# Patient Record
Sex: Female | Born: 1990 | Race: Black or African American | Hispanic: No | Marital: Married | Smoking: Never smoker
Health system: Southern US, Community
[De-identification: ages and names within clinical notes are randomized; demographics above are authoritative.]

## PROBLEM LIST (undated history)

## (undated) ENCOUNTER — Emergency Department (HOSPITAL_BASED_OUTPATIENT_CLINIC_OR_DEPARTMENT_OTHER): Admission: EM | Discharge status: Absent without leave | Payer: Medicaid Other

## (undated) DIAGNOSIS — D219 Benign neoplasm of connective and other soft tissue, unspecified: Secondary | ICD-10-CM

## (undated) DIAGNOSIS — Z5189 Encounter for other specified aftercare: Secondary | ICD-10-CM

## (undated) DIAGNOSIS — F209 Schizophrenia, unspecified: Secondary | ICD-10-CM

## (undated) DIAGNOSIS — F319 Bipolar disorder, unspecified: Secondary | ICD-10-CM

## (undated) DIAGNOSIS — D649 Anemia, unspecified: Secondary | ICD-10-CM

## (undated) DIAGNOSIS — N946 Dysmenorrhea, unspecified: Secondary | ICD-10-CM

## (undated) HISTORY — PX: DILATION AND CURETTAGE OF UTERUS: SHX78

---
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
## 2011-05-11 ENCOUNTER — Emergency Department (HOSPITAL_BASED_OUTPATIENT_CLINIC_OR_DEPARTMENT_OTHER)
Admission: EM | Admit: 2011-05-11 | Discharge: 2011-05-11 | Disposition: A | Discharge status: Home / Self Care | Payer: Self-pay | Attending: Emergency Medicine | Admitting: Emergency Medicine

## 2011-05-11 ENCOUNTER — Encounter: Payer: Self-pay | Admitting: *Deleted

## 2011-05-11 DIAGNOSIS — N898 Other specified noninflammatory disorders of vagina: Secondary | ICD-10-CM | POA: Insufficient documentation

## 2011-05-11 DIAGNOSIS — N939 Abnormal uterine and vaginal bleeding, unspecified: Secondary | ICD-10-CM

## 2011-05-11 DIAGNOSIS — J45909 Unspecified asthma, uncomplicated: Secondary | ICD-10-CM | POA: Insufficient documentation

## 2011-05-11 LAB — DIFFERENTIAL
Lymphs Abs: 2.8 10*3/uL (ref 0.7–4.0)
Monocytes Relative: 7 % (ref 3–12)
Neutro Abs: 3.6 10*3/uL (ref 1.7–7.7)
Neutrophils Relative %: 52 % (ref 43–77)

## 2011-05-11 LAB — CBC
HCT: 26.9 % — ABNORMAL LOW (ref 36.0–46.0)
Hemoglobin: 8.6 g/dL — ABNORMAL LOW (ref 12.0–15.0)
MCH: 23.3 pg — ABNORMAL LOW (ref 26.0–34.0)
RBC: 3.69 MIL/uL — ABNORMAL LOW (ref 3.87–5.11)

## 2011-05-11 LAB — WET PREP, GENITAL: WBC, Wet Prep HPF POC: NONE SEEN

## 2011-05-11 MED ORDER — METRONIDAZOLE 500 MG PO TABS: 500.0000 mg | ORAL_TABLET | Freq: Two times a day (BID) | ORAL | Status: AC

## 2011-05-11 MED ORDER — NORETHINDRONE-ETH ESTRADIOL 1-35 MG-MCG PO TABS: ORAL_TABLET | ORAL | Status: DC

## 2011-05-11 NOTE — ED Provider Notes (Signed)
History     CSN: [id number] Arrival date & time: 05/11/2011  3:22 PM   First MD Initiated Contact with Patient 05/11/11 1602      Chief Complaint  Patient presents with  . Vaginal Bleeding    (Consider location/radiation/quality/duration/timing/severity/associated sxs/prior treatment) Patient is a 20 y.o. female presenting with vaginal bleeding. The history is provided by the patient. No language interpreter was used.  Vaginal Bleeding The current episode started more than 1 month ago. The problem occurs constantly. The problem has been gradually worsening. The symptoms are aggravated by nothing. She has tried NSAIDs for the symptoms. The treatment provided no relief.  Pt reports she has had bleeding for the past 4 weeks.  Pt reports she was seen at [facility] and had an ultrasound.  Pt was referred to [facility] but did not follow up.  Pt was placed on provera.  Pt reports she is taking provera but bleeding has increaased.  Past Medical History  Diagnosis Date  . Asthma     History reviewed. No pertinent past surgical history.  History reviewed. No pertinent family history.  History  Substance Use Topics  . Smoking status: Never Smoker   . Smokeless tobacco: Not on file  . Alcohol Use: No    OB History    Grav Para Term Preterm Abortions TAB SAB Ect Mult Living                  Review of Systems  Genitourinary: Positive for vaginal bleeding.  All other systems reviewed and are negative.    Allergies  Onion  Home Medications   Current Outpatient Rx  Name Route Sig Dispense Refill  . EPINEPHRINE 0.3 MG/0.3ML IJ DEVI Intramuscular Inject 0.3 mg into the muscle once.        BP 128/59  Pulse 76  Temp 98.1 F (36.7 C)  Resp 16  Ht 5\' 1"  (1.549 m)  Wt 178 lb (80.74 kg)  BMI 33.63 kg/m2  SpO2 100%  Physical Exam  Nursing note and vitals reviewed. Constitutional: She is oriented to person, place, and time. She appears well-developed and well-nourished.  HENT:   Head: Normocephalic and atraumatic.  Right Ear: External ear normal.  Left Ear: External ear normal.  Mouth/Throat: Oropharynx is clear and moist.  Eyes: Conjunctivae and EOM are normal. Pupils are equal, round, and reactive to light.  Neck: Normal range of motion. Neck supple.  Cardiovascular: Normal rate and normal heart sounds.   Pulmonary/Chest: Effort normal.  Abdominal: Soft.  Musculoskeletal: Normal range of motion.  Neurological: She is alert and oriented to person, place, and time. She has normal reflexes.  Skin: Skin is warm.  Psychiatric: She has a normal mood and affect.    ED Course  Procedures (including critical care time)  Labs Reviewed - No data to display No results found.   No diagnosis found.    MDM  Pt has clue cells on wet prep.  Pt's hemoglobin is 8.6.    I advised stop provera.  Pt given flagyl and I placed on OCP and iron.  Pt advised to schedule with gyn clinic.   Pt advised to go to [facility] or [facility] is bleeding persist.         Langston Masker, [place] 05/11/11 1756

## 2011-05-11 NOTE — ED Notes (Signed)
Pt c/o vaginal bleeding x 3 months, was seen at [facility] x 5 days ago for same [place] ect.. Done pt didn't f/u with obgyn

## 2011-05-11 NOTE — ED Provider Notes (Signed)
Medical screening examination/treatment/procedure(s) were performed by non-physician practitioner and as supervising physician I was immediately available for consultation/collaboration.   Charmian Forbis, MD 05/11/11 2342 

## 2011-05-11 NOTE — ED Notes (Signed)
Pelvic cart set up 

## 2011-05-12 LAB — GC/CHLAMYDIA PROBE AMP, GENITAL: GC Probe Amp, Genital: NEGATIVE

## 2014-09-17 ENCOUNTER — Emergency Department (HOSPITAL_BASED_OUTPATIENT_CLINIC_OR_DEPARTMENT_OTHER)
Admission: EM | Admit: 2014-09-17 | Discharge: 2014-09-18 | Disposition: A | Discharge status: Other Acute Inpatient Hospital | Payer: Self-pay | Attending: Emergency Medicine | Admitting: Emergency Medicine

## 2014-09-17 ENCOUNTER — Encounter (HOSPITAL_BASED_OUTPATIENT_CLINIC_OR_DEPARTMENT_OTHER): Payer: Self-pay | Admitting: *Deleted

## 2014-09-17 DIAGNOSIS — Z3202 Encounter for pregnancy test, result negative: Secondary | ICD-10-CM | POA: Insufficient documentation

## 2014-09-17 DIAGNOSIS — Z862 Personal history of diseases of the blood and blood-forming organs and certain disorders involving the immune mechanism: Secondary | ICD-10-CM | POA: Insufficient documentation

## 2014-09-17 DIAGNOSIS — Z8659 Personal history of other mental and behavioral disorders: Secondary | ICD-10-CM | POA: Insufficient documentation

## 2014-09-17 DIAGNOSIS — N938 Other specified abnormal uterine and vaginal bleeding: Secondary | ICD-10-CM | POA: Insufficient documentation

## 2014-09-17 DIAGNOSIS — M549 Dorsalgia, unspecified: Secondary | ICD-10-CM | POA: Insufficient documentation

## 2014-09-17 DIAGNOSIS — J45909 Unspecified asthma, uncomplicated: Secondary | ICD-10-CM | POA: Insufficient documentation

## 2014-09-17 HISTORY — DX: Bipolar disorder, unspecified: F31.9

## 2014-09-17 HISTORY — DX: Anemia, unspecified: D64.9

## 2014-09-17 HISTORY — DX: Schizophrenia, unspecified: F20.9

## 2014-09-17 LAB — COMPREHENSIVE METABOLIC PANEL
ALBUMIN: 3.6 g/dL (ref 3.5–5.2)
ALT: 13 U/L (ref 0–35)
AST: 15 U/L (ref 0–37)
Alkaline Phosphatase: 92 U/L (ref 39–117)
Anion gap: 7 (ref 5–15)
BILIRUBIN TOTAL: 0.1 mg/dL — AB (ref 0.3–1.2)
BUN: 17 mg/dL (ref 6–23)
CO2: 25 mmol/L (ref 19–32)
Calcium: 8.6 mg/dL (ref 8.4–10.5)
Chloride: 107 mmol/L (ref 96–112)
Creatinine, Ser: 0.94 mg/dL (ref 0.50–1.10)
GFR calc Af Amer: >90 mL/min (ref >90)
GFR calc non Af Amer: 84 mL/min — ABNORMAL LOW (ref >90)
Glucose, Bld: 97 mg/dL (ref 70–99)
POTASSIUM: 3.3 mmol/L — AB (ref 3.5–5.1)
SODIUM: 139 mmol/L (ref 135–145)
Total Protein: 7.2 g/dL (ref 6.0–8.3)

## 2014-09-17 LAB — URINE MICROSCOPIC-ADD ON

## 2014-09-17 LAB — CBC WITH DIFFERENTIAL/PLATELET
BASOS ABS: 0 10*3/uL (ref 0.0–0.1)
Basophils Relative: 0 % (ref 0–1)
EOS ABS: 0 10*3/uL (ref 0.0–0.7)
Eosinophils Relative: 0 % (ref 0–5)
HCT: 22.5 % — ABNORMAL LOW (ref 36.0–46.0)
HEMOGLOBIN: 6.6 g/dL — AB (ref 12.0–15.0)
Lymphocytes Relative: 39 % (ref 12–46)
Lymphs Abs: 3.4 10*3/uL (ref 0.7–4.0)
MCH: 18.3 pg — AB (ref 26.0–34.0)
MCHC: 29.3 g/dL — AB (ref 30.0–36.0)
MCV: 62.3 fL — ABNORMAL LOW (ref 78.0–100.0)
Monocytes Absolute: 0.7 10*3/uL (ref 0.1–1.0)
Monocytes Relative: 8 % (ref 3–12)
NEUTROS PCT: 53 % (ref 43–77)
Neutro Abs: 4.5 10*3/uL (ref 1.7–7.7)
PLATELETS: 461 10*3/uL — AB (ref 150–400)
RBC: 3.61 MIL/uL — ABNORMAL LOW (ref 3.87–5.11)
RDW: 17.8 % — ABNORMAL HIGH (ref 11.5–15.5)
WBC: 8.6 10*3/uL (ref 4.0–10.5)

## 2014-09-17 LAB — URINALYSIS, ROUTINE W REFLEX MICROSCOPIC
BILIRUBIN URINE: NEGATIVE
GLUCOSE, UA: NEGATIVE mg/dL
Ketones, ur: NEGATIVE mg/dL
Leukocytes, UA: NEGATIVE
Nitrite: NEGATIVE
PH: 6 (ref 5.0–8.0)
PROTEIN: NEGATIVE mg/dL
SPECIFIC GRAVITY, URINE: 1.028 (ref 1.005–1.030)
UROBILINOGEN UA: 1 mg/dL (ref 0.0–1.0)

## 2014-09-17 LAB — WET PREP, GENITAL
Trich, Wet Prep: NONE SEEN
YEAST WET PREP: NONE SEEN

## 2014-09-17 LAB — PREGNANCY, URINE: PREG TEST UR: NEGATIVE

## 2014-09-17 NOTE — ED Notes (Signed)
Back pain x 2 weeks. She also complains of vaginal bleeding for 3 months.

## 2014-09-17 NOTE — ED Provider Notes (Signed)
CSN: [id number]     Arrival date & time 09/17/14  1927 History   First MD Initiated Contact with Patient 09/17/14 2111     Chief Complaint  Patient presents with  . Back Pain  . Vaginal Bleeding     (Consider location/radiation/quality/duration/timing/severity/associated sxs/prior Treatment) HPI Comments: Patient with a history of heavy menses and anemia requiring blood transfusions presents today with a chief complaint of vaginal bleeding.  She states that she has had vaginal bleeding daily for the past 3 months.  She states that she is changing a pad every 2 hours and that the pain is saturated when she changes it.  She states that she has been worked up for this in the past, but no reason for the bleeding was identified.  She has also been on OCP for this, but did not feel that it helped.  She states that she went off of the OCP approximately six months ago.  She states that back in 2014 she had so much bleeding that she required a transfusion.  She states that she is currently taking an OTC Iron supplement.  She does reports some intermittent lightheadedness when she goes from a sitting to standing position.  She also reports mild abdominal cramping of her lower abdomen.   She denies chest pain, SOB, or syncope.    Patient is also complaining of lower back pain.  She states that this pain has been present for the past 2 weeks.  Pain located across lower back and does not radiate.  Pain is worse with bending over and lifting.  She states that she does a lot of bending and lifting at work.  She denies fever, chills,  numbness, tingling, urinary symptoms, or bowel/bladder incontinence.      Patient is a 24 y.o. female presenting with back pain and vaginal bleeding. The history is provided by the patient.  Back Pain Vaginal Bleeding Associated symptoms: back pain     Past Medical History  Diagnosis Date  . Asthma   . Anemia   . Bipolar 1 disorder   . Schizophrenia    No past surgical  history on file. No family history on file. History  Substance Use Topics  . Smoking status: Never Smoker   . Smokeless tobacco: Not on file  . Alcohol Use: No   OB History    No data available     Review of Systems  Genitourinary: Positive for vaginal bleeding.  Musculoskeletal: Positive for back pain.  All other systems reviewed and are negative.     Allergies  Onion  Home Medications   Prior to Admission medications   Medication Sig Start Date End Date Taking? Authorizing Provider  EPINEPHrine (EPIPEN 2-PAK) 0.3 mg/0.3 mL DEVI Inject 0.3 mg into the muscle once.      Historical Provider, MD  norethindrone-ethinyl estradiol 1/35 ([place] 1/35, 28,) tablet One tablet three times a day then start new pack one a day 05/11/11   Fransico Meadow, PA-C   BP 106/58 mmHg  Pulse 88  Temp(Src) 98.1 F (36.7 C) (Oral)  Resp 20  SpO2 100%  LMP 06/18/2014 Physical Exam  Constitutional: She appears well-developed and well-nourished.  HENT:  Head: Normocephalic and atraumatic.  Mouth/Throat: Oropharynx is clear and moist.  Neck: Normal range of motion. Neck supple.  Cardiovascular: Normal rate, regular rhythm and normal heart sounds.   Pulmonary/Chest: Effort normal and breath sounds normal.  Abdominal: Soft. Bowel sounds are normal. She exhibits no distension and no  mass. There is tenderness in the suprapubic area. There is no rebound and no guarding.  Genitourinary: Cervix exhibits no motion tenderness. Right adnexum displays no mass, no tenderness and no fullness. Left adnexum displays tenderness. Left adnexum displays no mass and no fullness.  Moderate amount of dark blood in the vaginal vault.  No clots visualized.  Mild left adnexal tenderness.  Musculoskeletal: Normal range of motion.  Tenderness to palpation across lower back  Neurological: She is alert. She has normal strength. No sensory deficit.  Skin: Skin is warm and dry.  Psychiatric: She has a normal mood and  affect.  Nursing note and vitals reviewed.   ED Course  Procedures (including critical care time) Labs Review Labs Reviewed  URINALYSIS, ROUTINE W REFLEX MICROSCOPIC - Abnormal; Notable for the following:    Hgb urine dipstick MODERATE (*)    All other components within normal limits  WET PREP, GENITAL  PREGNANCY, URINE  URINE MICROSCOPIC-ADD ON  CBC WITH DIFFERENTIAL/PLATELET  COMPREHENSIVE METABOLIC PANEL  GC/CHLAMYDIA PROBE AMP ([place])    Imaging Review No results found.   EKG Interpretation None     10:30 PM Discussed with Dr. Bethann Goo the patient's GYN.  He recommends having the patient transferred to [facility].  He states that he will admit her there.   MDM   Final diagnoses:  None   Patient with a history of heavy menses and anemia presents today with vaginal bleeding x 3 months.  She reports that she has been bleeding daily.  Hemoglobin is 6.6 in the ED and she has active vaginal bleeding.  She reports that she does become lightheaded when she stands up.  Vital signs are stable. Urine pregnancy is negative.  Patient discussed with the patient's Gynecologist Dr Bethann Goo.  He recommends having the patient admitted to [facility] where he will admit her for a blood transfusion.  Discussed plan with patient who is in agreement with the plan.  Patient stable for transfer.      Hyman Bible, PA-C 09/18/14 2258  Blanchie Dessert, MD 09/18/14 [phone]

## 2014-09-17 NOTE — ED Notes (Signed)
Pt. Reports she is not sexually active and takes no birth control.

## 2014-09-20 LAB — GC/CHLAMYDIA PROBE AMP (~~LOC~~) NOT AT ARMC
CHLAMYDIA, DNA PROBE: NEGATIVE
NEISSERIA GONORRHEA: NEGATIVE

## 2016-11-26 ENCOUNTER — Encounter (HOSPITAL_BASED_OUTPATIENT_CLINIC_OR_DEPARTMENT_OTHER): Payer: Self-pay | Admitting: *Deleted

## 2016-11-26 ENCOUNTER — Emergency Department (HOSPITAL_BASED_OUTPATIENT_CLINIC_OR_DEPARTMENT_OTHER)
Admission: EM | Admit: 2016-11-26 | Discharge: 2016-11-27 | Disposition: A | Discharge status: Home / Self Care | Payer: Self-pay | Attending: Emergency Medicine | Admitting: Emergency Medicine

## 2016-11-26 ENCOUNTER — Emergency Department (HOSPITAL_BASED_OUTPATIENT_CLINIC_OR_DEPARTMENT_OTHER): Payer: Self-pay

## 2016-11-26 DIAGNOSIS — J45909 Unspecified asthma, uncomplicated: Secondary | ICD-10-CM | POA: Insufficient documentation

## 2016-11-26 DIAGNOSIS — N939 Abnormal uterine and vaginal bleeding, unspecified: Secondary | ICD-10-CM | POA: Insufficient documentation

## 2016-11-26 DIAGNOSIS — S301XXD Contusion of abdominal wall, subsequent encounter: Secondary | ICD-10-CM | POA: Insufficient documentation

## 2016-11-26 LAB — URINALYSIS, ROUTINE W REFLEX MICROSCOPIC
Bilirubin Urine: NEGATIVE
GLUCOSE, UA: NEGATIVE mg/dL
Ketones, ur: NEGATIVE mg/dL
Leukocytes, UA: NEGATIVE
Nitrite: NEGATIVE
Protein, ur: NEGATIVE mg/dL
SPECIFIC GRAVITY, URINE: 1.014 (ref 1.005–1.030)
pH: 5.5 (ref 5.0–8.0)

## 2016-11-26 LAB — URINALYSIS, MICROSCOPIC (REFLEX)

## 2016-11-26 LAB — PREGNANCY, URINE: Preg Test, Ur: NEGATIVE
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
## 2016-11-26 NOTE — ED Triage Notes (Signed)
Pt reports vaginal bleeding-had a D&C last month for same.

## 2016-11-27 MED ORDER — HYDROCODONE-ACETAMINOPHEN 5-325 MG PO TABS
1.0000 | ORAL_TABLET | Freq: Once | ORAL | Status: AC
  Administered 2016-11-27: 1 via ORAL
  Filled 2016-11-27: qty 1

## 2016-11-27 MED ORDER — HYDROCODONE-ACETAMINOPHEN 5-325 MG PO TABS: 1.0000 | ORAL_TABLET | Freq: Four times a day (QID) | ORAL | 0 refills | Status: DC | PRN
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
## 2017-08-06 ENCOUNTER — Encounter (HOSPITAL_BASED_OUTPATIENT_CLINIC_OR_DEPARTMENT_OTHER): Payer: Self-pay | Admitting: *Deleted

## 2017-08-06 ENCOUNTER — Other Ambulatory Visit: Payer: Self-pay

## 2017-08-06 ENCOUNTER — Emergency Department (HOSPITAL_BASED_OUTPATIENT_CLINIC_OR_DEPARTMENT_OTHER)
Admission: EM | Admit: 2017-08-06 | Discharge: 2017-08-07 | Disposition: A | Discharge status: Home / Self Care | Payer: Medicaid Other | Attending: Emergency Medicine | Admitting: Emergency Medicine

## 2017-08-06 DIAGNOSIS — K529 Noninfective gastroenteritis and colitis, unspecified: Secondary | ICD-10-CM

## 2017-08-06 DIAGNOSIS — Z79899 Other long term (current) drug therapy: Secondary | ICD-10-CM | POA: Insufficient documentation

## 2017-08-06 DIAGNOSIS — J45909 Unspecified asthma, uncomplicated: Secondary | ICD-10-CM | POA: Diagnosis not present

## 2017-08-06 DIAGNOSIS — J029 Acute pharyngitis, unspecified: Secondary | ICD-10-CM | POA: Diagnosis present
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
## 2017-08-07 ENCOUNTER — Emergency Department (HOSPITAL_BASED_OUTPATIENT_CLINIC_OR_DEPARTMENT_OTHER): Payer: Medicaid Other

## 2017-08-07 MED ORDER — ONDANSETRON 4 MG PO TBDP: ORAL_TABLET | ORAL | 0 refills | Status: DC

## 2017-08-07 MED ORDER — LOPERAMIDE HCL 2 MG PO CAPS: 2.0000 mg | ORAL_CAPSULE | Freq: Four times a day (QID) | ORAL | 0 refills | Status: DC | PRN

## 2017-09-13 ENCOUNTER — Emergency Department (HOSPITAL_BASED_OUTPATIENT_CLINIC_OR_DEPARTMENT_OTHER)
Admission: EM | Admit: 2017-09-13 | Discharge: 2017-09-13 | Disposition: A | Discharge status: Home / Self Care | Payer: Medicaid Other | Attending: Emergency Medicine | Admitting: Emergency Medicine

## 2017-09-13 ENCOUNTER — Other Ambulatory Visit: Payer: Self-pay

## 2017-09-13 ENCOUNTER — Encounter (HOSPITAL_BASED_OUTPATIENT_CLINIC_OR_DEPARTMENT_OTHER): Payer: Self-pay | Admitting: *Deleted

## 2017-09-13 DIAGNOSIS — Z79899 Other long term (current) drug therapy: Secondary | ICD-10-CM | POA: Diagnosis not present

## 2017-09-13 DIAGNOSIS — J45909 Unspecified asthma, uncomplicated: Secondary | ICD-10-CM | POA: Diagnosis not present

## 2017-09-13 DIAGNOSIS — J029 Acute pharyngitis, unspecified: Secondary | ICD-10-CM | POA: Diagnosis present

## 2017-09-13 LAB — RAPID STREP SCREEN (MED CTR MEBANE ONLY): Streptococcus, Group A Screen (Direct): NEGATIVE

## 2017-09-13 MED ORDER — GUAIFENESIN ER 1200 MG PO TB12: 1.0000 | ORAL_TABLET | Freq: Two times a day (BID) | ORAL | 1 refills | Status: DC | PRN

## 2017-09-13 MED ORDER — LIDOCAINE VISCOUS 2 % MT SOLN: 15.0000 mL | OROMUCOSAL | 0 refills | Status: DC | PRN

## 2017-09-13 MED ORDER — IBUPROFEN 600 MG PO TABS: 600.0000 mg | ORAL_TABLET | Freq: Four times a day (QID) | ORAL | 0 refills | Status: DC | PRN

## 2017-09-13 NOTE — Discharge Instructions (Signed)
Please read and follow all provided instructions.  Your diagnoses today include: Viral Pharyngitis   Tests performed today include: Vital signs. See below for your results today.  Strep Test: This was negative. A throat culture was sent as a precaution and results will be available in 2-3 days. If it returns positive for strep, you will be called by our flow manager for further instructions.  Medications prescribed:  1. Take as prescribed.   Home care instructions:  At this time, it appears that your sore throat is caused by a viral infection. Antibiotics do NOT help a viral infection and can cause unwanted side effects. The fever should resolve in 2-3 days and sore throat should begin to resolve in 2-3 days as well. Continued to alternate between Tylenol and ibuprofen for pain. May consider over-the-counter Benadryl for additional relief (decrease secretions). Also discard your toothbrush and begin using a new one in 3 days.   Follow-up instructions: Please follow-up with your primary care provider in 2-3 days for follow up.   Return instructions:  Return to the ED sooner for worsening condition, inability to swallow, breathing difficulty, new concerns.  Additional Information:  Your vital signs today were: BP 100/66    Pulse 92    Temp 98.1 F (36.7 C) (Oral)    Resp 20    Ht 5\' 1"  (1.549 m)    Wt 106.6 kg (235 lb)    SpO2 100%    BMI 44.40 kg/m  If your blood pressure (BP) was elevated above 135/85 this visit, please have this repeated by your doctor within one month. ---------------

## 2017-09-13 NOTE — ED Notes (Signed)
Pt discharged to home with family. NAD.  

## 2017-09-13 NOTE — ED Provider Notes (Signed)
[facility] EMERGENCY DEPARTMENT Provider Note   CSN: [id number] Arrival date & time: 09/13/17  2141     History   Chief Complaint Chief Complaint  Patient presents with  . Sore Throat    HPI Tracey Copeland is a 27 y.o. female who presents to the emergency department today for 2 days of subjective fever, chills, congestion and sore throat.  Patient states that her symptoms have worsened today and now she has associated dysphasia.  She has taken ibuprofen for her symptoms without any relief. Denies inability to control secretions, N/V, abdominal pain, cough,  voice change, dental disease, or trauma.    HPI  Past Medical History:  Diagnosis Date  . Anemia   . Asthma   . Bipolar 1 disorder ([place])   . Schizophrenia ([place])     There are no active problems to display for this patient.   Past Surgical History:  Procedure Laterality Date  . DILATION AND CURETTAGE OF UTERUS      OB History    No data available       Home Medications    Prior to Admission medications   Medication Sig Start Date End Date Taking? Authorizing Provider  EPINEPHrine (EPIPEN 2-PAK) 0.3 mg/0.3 mL DEVI Inject 0.3 mg into the muscle once.      [provider]  HYDROcodone-acetaminophen (NORCO) 5-325 MG tablet Take 1 tablet by mouth every 6 (six) hours as needed (for pain). 11/27/16   Molpus, John, MD  IRON PO Take by mouth.    [provider]  loperamide (IMODIUM) 2 MG capsule Take 1 capsule (2 mg total) by mouth 4 (four) times daily as needed for diarrhea or loose stools. 08/07/17   Etta Quill, NP  norethindrone-ethinyl estradiol 1/35 ([place] 1/35, 28,) tablet One tablet three times a day then start new pack one a day 05/11/11   Fransico Meadow, PA-C  ondansetron (ZOFRAN ODT) 4 MG disintegrating tablet 4mg  ODT q6 hours prn nausea/vomit 08/07/17   Etta Quill, NP    Family History No family history on file.  Social History Social History   Tobacco Use  .  Smoking status: Never Smoker  . Smokeless tobacco: Never Used  Substance Use Topics  . Alcohol use: No  . Drug use: No     Allergies   Onion   Review of Systems Review of Systems  All other systems reviewed and are negative.    Physical Exam Updated Vital Signs BP 100/66   Pulse 92   Temp 98.1 F (36.7 C) (Oral)   Resp 20   Ht 5\' 1"  (1.549 m)   Wt 106.6 kg (235 lb)   SpO2 100%   BMI 44.40 kg/m   Physical Exam  Constitutional: She appears well-developed and well-nourished. No distress.  HENT:  Head: Normocephalic and atraumatic.  Right Ear: Hearing and external ear normal. No foreign bodies. Tympanic membrane is not injected, not perforated, not erythematous, not retracted and not bulging.  Left Ear: Hearing, tympanic membrane, external ear and ear canal normal. No foreign bodies. Tympanic membrane is not injected, not perforated, not erythematous, not retracted and not bulging.  Nose: Mucosal edema present. No rhinorrhea, sinus tenderness, septal deviation or nasal septal hematoma.  No foreign bodies. Right sinus exhibits no maxillary sinus tenderness and no frontal sinus tenderness. Left sinus exhibits no maxillary sinus tenderness and no frontal sinus tenderness.  Right ear canal unable to see TM 2/2 wax. The patient has normal phonation and is in  control of secretions. No stridor.  Midline uvula without edema. Soft palate rises symmetrically.  Mild tonsillar erythema without hypertrophy or exudates. No PTA. Tongue protrusion is normal. No trismus. No creptius on neck palpation and patient has good dentition. No gingival erythema or fluctuance noted. Mucus membranes moist.  Eyes: Conjunctivae, EOM and lids are normal. Pupils are equal, round, and reactive to light. Right eye exhibits no discharge. Left eye exhibits no discharge. Right conjunctiva is not injected. Left conjunctiva is not injected.  Neck: Trachea normal, normal range of motion, full passive range of motion  without pain and phonation normal. Neck supple. No spinous process tenderness and no muscular tenderness present. No neck rigidity. No tracheal deviation and normal range of motion present.  No nuchal rigidity or meningismus  Cardiovascular: Normal rate and regular rhythm.  Pulmonary/Chest: Effort normal and breath sounds normal. No stridor. She has no wheezes.  Abdominal: Soft.  Lymphadenopathy:       Head (right side): No submental, no submandibular, no tonsillar, no preauricular, no posterior auricular and no occipital adenopathy present.       Head (left side): No submental, no submandibular, no tonsillar, no preauricular, no posterior auricular and no occipital adenopathy present.    She has no cervical adenopathy.  Neurological: She is alert.  Skin: Skin is warm and dry. No rash noted.  Psychiatric: She has a normal mood and affect.  Nursing note and vitals reviewed.    ED Treatments / Results  Labs (all labs ordered are listed, but only abnormal results are displayed) Labs Reviewed  RAPID STREP SCREEN (NOT AT [facility])  CULTURE, GROUP A STREP [facility])    EKG  EKG Interpretation None       Radiology No results found.  Procedures Procedures (including critical care time)  Medications Ordered in ED Medications - No data to display   Initial Impression / Assessment and Plan / ED Course  I have reviewed the triage vital signs and the nursing notes.  Pertinent labs & imaging results that were available during my care of the patient were reviewed by me and considered in my medical decision making (see chart for details).     27 y.o. female with 2 days of ST. Pt afebrile without tonsillar exudate, negative strep. Presents with mild cervical lymphadenopathy, & dysphagia; diagnosis of viral pharyngitis. No abx indicated. Discharged with symptomatic tx for pain  Pt does not appear dehydrated, but did discuss importance of water rehydration. Presentation non concerning for PTA or  RPA. No trismus or uvula deviation. Specific return precautions discussed. Pt able to drink water in ED without difficulty with intact air way. Recommended PCP follow up.  Final Clinical Impressions(s) / ED Diagnoses   Final diagnoses:  Viral pharyngitis    ED Discharge Orders        Ordered    Guaifenesin [facility] MAXIMUM STRENGTH) 1200 MG TB12  Every 12 hours PRN     09/13/17 2255    ibuprofen (ADVIL,MOTRIN) 600 MG tablet  Every 6 hours PRN     09/13/17 2255    lidocaine (XYLOCAINE) 2 % solution  As needed     09/13/17 2255       Lorelle Gibbs 09/13/17 2259    Fatima Blank, MD 09/13/17 [phone]

## 2017-09-13 NOTE — ED Triage Notes (Signed)
Sore throat, chills, stuffy nose x 2 days.

## 2017-09-17 LAB — CULTURE, GROUP A STREP (THRC)

## 2017-12-05 ENCOUNTER — Other Ambulatory Visit: Payer: Self-pay

## 2017-12-05 ENCOUNTER — Encounter (HOSPITAL_BASED_OUTPATIENT_CLINIC_OR_DEPARTMENT_OTHER): Payer: Self-pay

## 2017-12-05 ENCOUNTER — Emergency Department (HOSPITAL_BASED_OUTPATIENT_CLINIC_OR_DEPARTMENT_OTHER)
Admission: EM | Admit: 2017-12-05 | Discharge: 2017-12-05 | Disposition: A | Discharge status: Home / Self Care | Payer: Medicaid Other | Attending: Emergency Medicine | Admitting: Emergency Medicine

## 2017-12-05 DIAGNOSIS — S0086XA Insect bite (nonvenomous) of other part of head, initial encounter: Secondary | ICD-10-CM | POA: Insufficient documentation

## 2017-12-05 DIAGNOSIS — W57XXXA Bitten or stung by nonvenomous insect and other nonvenomous arthropods, initial encounter: Secondary | ICD-10-CM | POA: Insufficient documentation

## 2017-12-05 DIAGNOSIS — Y939 Activity, unspecified: Secondary | ICD-10-CM | POA: Insufficient documentation

## 2017-12-05 DIAGNOSIS — T63301A Toxic effect of unspecified spider venom, accidental (unintentional), initial encounter: Secondary | ICD-10-CM

## 2017-12-05 DIAGNOSIS — Y998 Other external cause status: Secondary | ICD-10-CM | POA: Insufficient documentation

## 2017-12-05 DIAGNOSIS — Y929 Unspecified place or not applicable: Secondary | ICD-10-CM | POA: Insufficient documentation

## 2017-12-05 DIAGNOSIS — Z79899 Other long term (current) drug therapy: Secondary | ICD-10-CM | POA: Diagnosis not present

## 2017-12-05 MED ORDER — DIPHENHYDRAMINE HCL 25 MG PO CAPS
50.0000 mg | ORAL_CAPSULE | Freq: Once | ORAL | Status: AC
  Administered 2017-12-05: 50 mg via ORAL
  Filled 2017-12-05: qty 2

## 2017-12-05 MED ORDER — HYDROCORTISONE 1 % EX CREA
TOPICAL_CREAM | Freq: Two times a day (BID) | CUTANEOUS | Status: DC
  Administered 2017-12-05: 03:00:00 via TOPICAL
  Filled 2017-12-05: qty 28

## 2017-12-05 NOTE — ED Notes (Signed)
Pt verbalizes understanding of d/c instructions and denies any further need at this time. 

## 2017-12-05 NOTE — ED Provider Notes (Signed)
[facility] DEPT [facility] Provider Note: Tracey Spurling, MD, FACEP  CSN: [id number] MRN: [id number] ARRIVAL: 12/05/17 at [place]: [place]  12/05/17 2:21 AM Tracey Copeland is a 27 y.o. female who has had an erythematous, tender, pruritic welt between her eyebrows for the past 2 days.  It is itching and burning "really bad".  She states she was bitten by a spider and her friend saw and killed the spider.  She has not treated this with anything except alcohol.  She had some nausea yesterday morning but none now.  She did have diarrhea.  She denies vomiting or abdominal pain.   Past Medical History:  Diagnosis Date  . Anemia   . Asthma   . Bipolar 1 disorder ([place])   . Schizophrenia [facility])     Past Surgical History:  Procedure Laterality Date  . DILATION AND CURETTAGE OF UTERUS      No family history on file.  Social History   Tobacco Use  . Smoking status: Never Smoker  . Smokeless tobacco: Never Used  Substance Use Topics  . Alcohol use: No  . Drug use: No    Prior to Admission medications   Medication Sig Start Date End Date Taking? Authorizing Provider  EPINEPHrine (EPIPEN 2-PAK) 0.3 mg/0.3 mL DEVI Inject 0.3 mg into the muscle once.      [provider]  Guaifenesin (MUCINEX MAXIMUM STRENGTH) 1200 MG TB12 Take 1 tablet (1,200 mg total) by mouth every 12 (twelve) hours as needed. 09/13/17   Copeland, Tracey Kirks, PA-C  HYDROcodone-acetaminophen (NORCO) 5-325 MG tablet Take 1 tablet by mouth every 6 (six) hours as needed (for pain). 11/27/16   Tracey Hinde, MD  ibuprofen (ADVIL,MOTRIN) 600 MG tablet Take 1 tablet (600 mg total) by mouth every 6 (six) hours as needed. 09/13/17   Copeland, Tracey Kirks, PA-C  IRON PO Take by mouth.    [provider]  lidocaine (XYLOCAINE) 2 % solution Use as directed 15 mLs in the mouth or throat as needed for mouth pain. 09/13/17   Copeland, Tracey Kirks, PA-C    loperamide (IMODIUM) 2 MG capsule Take 1 capsule (2 mg total) by mouth 4 (four) times daily as needed for diarrhea or loose stools. 08/07/17   Tracey Quill, NP  norethindrone-ethinyl estradiol 1/35 ([place] 1/35, 28,) tablet One tablet three times a day then start new pack one a day 05/11/11   Fransico Meadow, PA-C  ondansetron (ZOFRAN ODT) 4 MG disintegrating tablet 4mg  ODT q6 hours prn nausea/vomit 08/07/17   Tracey Quill, NP    Allergies Onion   REVIEW OF SYSTEMS  Negative except as noted here or in the History of Present Illness.   PHYSICAL EXAMINATION  Initial Vital Signs Blood pressure 115/72, pulse 91, temperature 98.4 F (36.9 C), temperature source Oral, resp. rate 18, height 5\' 1"  (1.549 m), weight 107.4 kg (236 lb 12.8 oz), last menstrual period 11/21/2017, SpO2 100 %.  Examination General: Well-developed, well-nourished female in no acute distress; appearance consistent with age of record HENT: normocephalic; tender welt of the glabella Eyes: pupils equal, round and reactive to light; extraocular muscles intact Neck: supple Heart: regular rate and rhythm Lungs: clear to auscultation bilaterally Abdomen: soft; nondistended; nontender; bowel sounds present Extremities: No deformity; full range of motion; pulses normal Neurologic: Awake, alert and oriented; motor function intact in all extremities and symmetric; no facial droop Skin: Warm and dry Psychiatric:  Normal mood and affect   RESULTS  Summary of this visit's results, reviewed by myself:   EKG Interpretation  Date/Time:    Ventricular Rate:    PR Interval:    QRS Duration:   QT Interval:    QTC Calculation:   R Axis:     Text Interpretation:        Laboratory Studies: No results found for this or any previous visit (from the past 24 hour(s)). Imaging Studies: No results found.  ED COURSE and MDM  Nursing notes and initial vitals signs, including pulse oximetry, reviewed.  Vitals:   12/05/17  0049 12/05/17 0050  BP: 115/72   Pulse: 91   Resp: 18   Temp: 98.4 F (36.9 C)   TempSrc: Oral   SpO2: 100%   Weight:  107.4 kg (236 lb 12.8 oz)  Height:  5\' 1"  (1.549 m)   Patient was advised to take Benadryl for the itching.  We will apply a topical corticosteroid for local relief.  History and symptoms are consistent with a spider bite.  It does not appear infected at this time.  PROCEDURES    ED DIAGNOSES     ICD-10-CM   1. Accidental spider bite W65.681E        Tracey Copeland, Tracey Reichmann, MD 12/05/17 [phone]

## 2017-12-05 NOTE — ED Triage Notes (Signed)
Pt has a red bump between her eyebrows for the last two days, has not tried any medications for her itching or burning, but has "alcoholed it."

## 2018-01-31 ENCOUNTER — Encounter (HOSPITAL_BASED_OUTPATIENT_CLINIC_OR_DEPARTMENT_OTHER): Payer: Self-pay | Admitting: Emergency Medicine

## 2018-01-31 ENCOUNTER — Other Ambulatory Visit: Payer: Self-pay

## 2018-01-31 ENCOUNTER — Emergency Department (HOSPITAL_BASED_OUTPATIENT_CLINIC_OR_DEPARTMENT_OTHER)
Admission: EM | Admit: 2018-01-31 | Discharge: 2018-01-31 | Disposition: A | Discharge status: Home / Self Care | Payer: Medicaid Other | Attending: Emergency Medicine | Admitting: Emergency Medicine

## 2018-01-31 DIAGNOSIS — R42 Dizziness and giddiness: Secondary | ICD-10-CM | POA: Diagnosis not present

## 2018-01-31 DIAGNOSIS — N921 Excessive and frequent menstruation with irregular cycle: Secondary | ICD-10-CM

## 2018-01-31 DIAGNOSIS — N938 Other specified abnormal uterine and vaginal bleeding: Secondary | ICD-10-CM | POA: Diagnosis not present

## 2018-01-31 DIAGNOSIS — J45909 Unspecified asthma, uncomplicated: Secondary | ICD-10-CM | POA: Diagnosis not present

## 2018-01-31 DIAGNOSIS — N939 Abnormal uterine and vaginal bleeding, unspecified: Secondary | ICD-10-CM | POA: Diagnosis present

## 2018-01-31 LAB — CBC WITH DIFFERENTIAL/PLATELET
BASOS PCT: 0 %
Basophils Absolute: 0 10*3/uL (ref 0.0–0.1)
EOS PCT: 1 %
Eosinophils Absolute: 0.1 10*3/uL (ref 0.0–0.7)
HCT: 29.6 % — ABNORMAL LOW (ref 36.0–46.0)
Hemoglobin: 9.1 g/dL — ABNORMAL LOW (ref 12.0–15.0)
LYMPHS ABS: 3.7 10*3/uL (ref 0.7–4.0)
Lymphocytes Relative: 38 %
MCH: 19.8 pg — ABNORMAL LOW (ref 26.0–34.0)
MCHC: 30.7 g/dL (ref 30.0–36.0)
MCV: 64.5 fL — ABNORMAL LOW (ref 78.0–100.0)
MONO ABS: 0.7 10*3/uL (ref 0.1–1.0)
Monocytes Relative: 7 %
Neutro Abs: 5.2 10*3/uL (ref 1.7–7.7)
Neutrophils Relative %: 54 %
Platelets: 447 10*3/uL — ABNORMAL HIGH (ref 150–400)
RBC: 4.59 MIL/uL (ref 3.87–5.11)
RDW: 17.9 % — AB (ref 11.5–15.5)
WBC: 9.7 10*3/uL (ref 4.0–10.5)

## 2018-01-31 LAB — BASIC METABOLIC PANEL
ANION GAP: 7 (ref 5–15)
BUN: 13 mg/dL (ref 6–20)
CALCIUM: 8.7 mg/dL — AB (ref 8.9–10.3)
CO2: 26 mmol/L (ref 22–32)
Chloride: 104 mmol/L (ref 98–111)
Creatinine, Ser: 0.81 mg/dL (ref 0.44–1.00)
GFR calc Af Amer: >60 mL/min (ref >60)
Glucose, Bld: 84 mg/dL (ref 70–99)
Potassium: 3.9 mmol/L (ref 3.5–5.1)
SODIUM: 137 mmol/L (ref 135–145)

## 2018-01-31 LAB — PREGNANCY, URINE: Preg Test, Ur: NEGATIVE

## 2018-01-31 MED ORDER — FERROUS SULFATE 325 (65 FE) MG PO TABS: 325.0000 mg | ORAL_TABLET | Freq: Every day | ORAL | 1 refills | Status: DC

## 2018-01-31 MED ORDER — KETOROLAC TROMETHAMINE 60 MG/2ML IM SOLN
60.0000 mg | Freq: Once | INTRAMUSCULAR | Status: AC
  Administered 2018-01-31: 60 mg via INTRAMUSCULAR
  Filled 2018-01-31: qty 2

## 2018-01-31 NOTE — ED Provider Notes (Signed)
[facility] EMERGENCY DEPARTMENT Provider Note   CSN: [id number] Arrival date & time: 01/31/18  1709     History   Chief Complaint Chief Complaint  Patient presents with  . Vaginal Bleeding    HPI Tracey Copeland is a 27 y.o. female.  The history is provided by the patient.  Vaginal Bleeding  Primary symptoms include vaginal bleeding. There has been no fever. This is a recurrent problem. Episode onset: heavier in the last few days. The problem occurs constantly. The problem has been gradually worsening. The symptoms occur spontaneously. She is not pregnant. The patient's menstrual history has been irregular. The discharge was bloody. Associated symptoms include light-headedness. Pertinent negatives include no anorexia, no abdominal swelling, no abdominal pain, no nausea and no vomiting. Associated symptoms comments: Lightheadedness starting today.. Treatments tried: lysteda. Sexual activity: sexually active with her female partner only. Associated medical issues comments: heavy vaginal bleeding resulting in transfusion.    Past Medical History:  Diagnosis Date  . Anemia   . Asthma   . Bipolar 1 disorder ([place])   . Schizophrenia ([place])     There are no active problems to display for this patient.   Past Surgical History:  Procedure Laterality Date  . DILATION AND CURETTAGE OF UTERUS       OB History   None      Home Medications    Prior to Admission medications   Medication Sig Start Date End Date Taking? Authorizing Provider  EPINEPHrine (EPIPEN 2-PAK) 0.3 mg/0.3 mL DEVI Inject 0.3 mg into the muscle once.      [provider]  Guaifenesin (MUCINEX MAXIMUM STRENGTH) 1200 MG TB12 Take 1 tablet (1,200 mg total) by mouth every 12 (twelve) hours as needed. 09/13/17   Maczis, Barth Kirks, PA-C  HYDROcodone-acetaminophen (NORCO) 5-325 MG tablet Take 1 tablet by mouth every 6 (six) hours as needed (for pain). 11/27/16   Molpus, John, MD  ibuprofen  (ADVIL,MOTRIN) 600 MG tablet Take 1 tablet (600 mg total) by mouth every 6 (six) hours as needed. 09/13/17   Maczis, Barth Kirks, PA-C  IRON PO Take by mouth.    [provider]  lidocaine (XYLOCAINE) 2 % solution Use as directed 15 mLs in the mouth or throat as needed for mouth pain. 09/13/17   Maczis, Barth Kirks, PA-C  loperamide (IMODIUM) 2 MG capsule Take 1 capsule (2 mg total) by mouth 4 (four) times daily as needed for diarrhea or loose stools. 08/07/17   Etta Quill, NP  norethindrone-ethinyl estradiol 1/35 ([place] 1/35, 28,) tablet One tablet three times a day then start new pack one a day 05/11/11   Fransico Meadow, PA-C  ondansetron (ZOFRAN ODT) 4 MG disintegrating tablet 4mg  ODT q6 hours prn nausea/vomit 08/07/17   Etta Quill, NP    Family History No family history on file.  Social History Social History   Tobacco Use  . Smoking status: Never Smoker  . Smokeless tobacco: Never Used  Substance Use Topics  . Alcohol use: No  . Drug use: No     Allergies   Onion   Review of Systems Review of Systems  Gastrointestinal: Negative for abdominal pain, anorexia, nausea and vomiting.  Genitourinary: Positive for vaginal bleeding.  Neurological: Positive for light-headedness.  All other systems reviewed and are negative.    Physical Exam Updated Vital Signs BP (!) 115/56 (BP Location: Right Arm)   Pulse 73   Temp 98.3 F (36.8 C) (Oral)   Resp 18  Ht 5\' 1"  (1.549 m)   Wt 111.1 kg (245 lb)   SpO2 100%   BMI 46.29 kg/m   Physical Exam  Constitutional: She is oriented to person, place, and time. She appears well-developed and well-nourished. No distress.  HENT:  Head: Normocephalic and atraumatic.  Mouth/Throat: Oropharynx is clear and moist.  Eyes: Pupils are equal, round, and reactive to light. Conjunctivae and EOM are normal.  Neck: Normal range of motion. Neck supple.  Cardiovascular: Normal rate, regular rhythm and intact distal pulses.  No murmur  heard. Pulmonary/Chest: Effort normal and breath sounds normal. No respiratory distress. She has no wheezes. She has no rales.  Abdominal: Soft. She exhibits no distension. There is no tenderness. There is no rebound and no guarding.  Musculoskeletal: Normal range of motion. She exhibits no edema or tenderness.  Neurological: She is alert and oriented to person, place, and time.  Skin: Skin is warm and dry. No rash noted. No erythema. There is pallor.  Psychiatric: She has a normal mood and affect. Her behavior is normal.  Nursing note and vitals reviewed.    ED Treatments / Results  Labs (all labs ordered are listed, but only abnormal results are displayed) Labs Reviewed  CBC WITH DIFFERENTIAL/PLATELET - Abnormal; Notable for the following components:      Result Value   Hemoglobin 9.1 (*)    HCT 29.6 (*)    MCV 64.5 (*)    MCH 19.8 (*)    RDW 17.9 (*)    Platelets 447 (*)    All other components within normal limits  BASIC METABOLIC PANEL - Abnormal; Notable for the following components:   Calcium 8.7 (*)    All other components within normal limits  PREGNANCY, URINE    EKG None  Radiology No results found.  Procedures Procedures (including critical care time)  Medications Ordered in ED Medications - No data to display   Initial Impression / Assessment and Plan / ED Course  I have reviewed the triage vital signs and the nursing notes.  Pertinent labs & imaging results that were available during my care of the patient were reviewed by me and considered in my medical decision making (see chart for details).     Patient presenting with menorrhagia which has been ongoing for quite some time but became worse in the last few months again.  She has required blood transfusions in the past for heavy vaginal bleeding most likely from fibroids.  She is desiring to become pregnant with IVF with her female partner but has not started that process yet until they can get the  bleeding under control.  She sees Dr. Micah Noel with OB/GYN but came today because she said the bleeding became heavier again despite the medication he had started her on and she was feeling lightheaded and concerned that her blood counts may be low.  She is currently taking over-the-counter iron.  She denies any vaginal discharge other than bladder or pelvic discomfort.  Patient has not been sexually active with men and is not concerned about sexually transmitted infection.  Hemoglobin today is 9 and she does not need transfusion.  Will give prescription iron and she will follow-up with Dr. Micah Noel but at this time continue the medication he prescribed for her.  Pregnancy is neg.  Final Clinical Impressions(s) / ED Diagnoses   Final diagnoses:  Dysfunctional uterine bleeding  Menorrhagia with irregular cycle    ED Discharge Orders        Ordered  ferrous sulfate 325 (65 FE) MG tablet  Daily     01/31/18 2118       Blanchie Dessert, MD 01/31/18 2118

## 2018-01-31 NOTE — ED Triage Notes (Signed)
Vaginal bleeding since march. States she has seen her doctor and given some pills but they didn't work.

## 2018-03-01 ENCOUNTER — Other Ambulatory Visit: Payer: Self-pay

## 2018-03-01 ENCOUNTER — Emergency Department (HOSPITAL_BASED_OUTPATIENT_CLINIC_OR_DEPARTMENT_OTHER)
Admission: EM | Admit: 2018-03-01 | Discharge: 2018-03-01 | Disposition: A | Discharge status: Home / Self Care | Payer: Medicaid Other | Attending: Emergency Medicine | Admitting: Emergency Medicine

## 2018-03-01 ENCOUNTER — Emergency Department (HOSPITAL_BASED_OUTPATIENT_CLINIC_OR_DEPARTMENT_OTHER): Payer: Medicaid Other

## 2018-03-01 DIAGNOSIS — Z79899 Other long term (current) drug therapy: Secondary | ICD-10-CM | POA: Insufficient documentation

## 2018-03-01 DIAGNOSIS — D649 Anemia, unspecified: Secondary | ICD-10-CM

## 2018-03-01 DIAGNOSIS — J45909 Unspecified asthma, uncomplicated: Secondary | ICD-10-CM | POA: Diagnosis not present

## 2018-03-01 DIAGNOSIS — T7840XA Allergy, unspecified, initial encounter: Secondary | ICD-10-CM | POA: Diagnosis not present

## 2018-03-01 DIAGNOSIS — R0602 Shortness of breath: Secondary | ICD-10-CM | POA: Insufficient documentation

## 2018-03-01 LAB — CBC WITH DIFFERENTIAL/PLATELET
BASOS ABS: 0 10*3/uL (ref 0.0–0.1)
BASOS PCT: 0 %
EOS ABS: 0.1 10*3/uL (ref 0.0–0.7)
Eosinophils Relative: 1 %
HEMATOCRIT: 25.4 % — AB (ref 36.0–46.0)
Hemoglobin: 7.5 g/dL — ABNORMAL LOW (ref 12.0–15.0)
LYMPHS ABS: 2.9 10*3/uL (ref 0.7–4.0)
LYMPHS PCT: 38 %
MCH: 17.9 pg — AB (ref 26.0–34.0)
MCHC: 29.5 g/dL — AB (ref 30.0–36.0)
MCV: 60.8 fL — AB (ref 78.0–100.0)
Monocytes Absolute: 0.7 10*3/uL (ref 0.1–1.0)
Monocytes Relative: 9 %
NEUTROS ABS: 4 10*3/uL (ref 1.7–7.7)
Neutrophils Relative %: 52 %
PLATELETS: 397 10*3/uL (ref 150–400)
RBC: 4.18 MIL/uL (ref 3.87–5.11)
RDW: 17.7 % — AB (ref 11.5–15.5)
WBC: 7.7 10*3/uL (ref 4.0–10.5)

## 2018-03-01 LAB — BASIC METABOLIC PANEL
ANION GAP: 9 (ref 5–15)
BUN: 15 mg/dL (ref 6–20)
CALCIUM: 9 mg/dL (ref 8.9–10.3)
CO2: 28 mmol/L (ref 22–32)
CREATININE: 0.89 mg/dL (ref 0.44–1.00)
Chloride: 103 mmol/L (ref 98–111)
GFR calc Af Amer: >60 mL/min (ref >60)
GLUCOSE: 86 mg/dL (ref 70–99)
Potassium: 3.5 mmol/L (ref 3.5–5.1)
Sodium: 140 mmol/L (ref 135–145)

## 2018-03-01 LAB — TROPONIN I: Troponin I: <0.03 ng/mL (ref <0.03)

## 2018-03-01 MED ORDER — ONDANSETRON 4 MG PO TBDP
4.0000 mg | ORAL_TABLET | Freq: Once | ORAL | Status: DC | PRN
  Filled 2018-03-01: qty 1

## 2018-03-01 MED ORDER — RANITIDINE HCL 150 MG PO CAPS: 150.0000 mg | ORAL_CAPSULE | Freq: Two times a day (BID) | ORAL | 0 refills | Status: DC

## 2018-03-01 MED ORDER — FAMOTIDINE 20 MG PO TABS
40.0000 mg | ORAL_TABLET | Freq: Once | ORAL | Status: AC
  Administered 2018-03-01: 40 mg via ORAL
  Filled 2018-03-01: qty 2

## 2018-03-01 MED ORDER — TRANEXAMIC ACID 650 MG PO TABS: 1300.0000 mg | ORAL_TABLET | Freq: Three times a day (TID) | ORAL | 0 refills | Status: AC

## 2018-03-01 MED ORDER — DIPHENHYDRAMINE HCL 25 MG PO CAPS
25.0000 mg | ORAL_CAPSULE | Freq: Once | ORAL | Status: AC
  Administered 2018-03-01: 25 mg via ORAL
  Filled 2018-03-01: qty 1

## 2018-03-01 MED ORDER — PREDNISONE 50 MG PO TABS: 50.0000 mg | ORAL_TABLET | Freq: Every day | ORAL | 0 refills | Status: AC

## 2018-03-01 MED ORDER — PREDNISONE 50 MG PO TABS
60.0000 mg | ORAL_TABLET | Freq: Once | ORAL | Status: AC
  Administered 2018-03-01: 60 mg via ORAL
  Filled 2018-03-01: qty 1

## 2018-03-01 MED ORDER — EPINEPHRINE 0.3 MG/0.3ML IJ SOAJ: 0.3000 mg | Freq: Once | INTRAMUSCULAR | 0 refills | Status: AC

## 2018-03-01 MED ORDER — DIPHENHYDRAMINE HCL 25 MG PO TABS: 25.0000 mg | ORAL_TABLET | Freq: Four times a day (QID) | ORAL | 0 refills | Status: DC | PRN

## 2018-03-01 NOTE — ED Triage Notes (Addendum)
Pt reports allergic reaction to onions yesterday, self-administering epi-pen and now is experiencing 8/10 CP, nausea and sob. Pt A+OX4, ambulatory independently, NAD.

## 2018-03-01 NOTE — ED Provider Notes (Signed)
[facility] EMERGENCY DEPARTMENT Provider Note   CSN: [id number] Arrival date & time: 03/01/18  0418     History   Chief Complaint Chief Complaint  Patient presents with  . Allergic Reaction  . Chest Pain    HPI Tracey Copeland is a 27 y.o. female.  HPI   27 yo F with h/o bipolar d/o, schizophrenia, here w/ allergic reaction. Pt states she has a prior allergy to onions. She was eating a burger from [employer] approx 24 hours ago when she took one bite and felt burning on her mouth. She then broke out in hives and started getting SOB. Her wife gave her an epipen and pt took 4 benadryl tabs. She then slept x 24 hours. During that time, she was reportedly thirsty and had one episode of vomiting. She called the local ED who told her to be evaluated, so she is here. She states she currently feels milldy SOB, mildly "itchy" but no longer has any lip or tongue swelling. No rash. No difficulty swallowing. She ahs not had any epi or benadryl in >20 hours. No other allergen trigger exposures. No fever or chills. No current n/v/d or abd pain. She does feel mildly more SOB than usual with a CP upon coughing, but this is a chronic issue which she attributes to her anemia.  Past Medical History:  Diagnosis Date  . Anemia   . Asthma   . Bipolar 1 disorder ([place])   . Schizophrenia ([place])     There are no active problems to display for this patient.   Past Surgical History:  Procedure Laterality Date  . DILATION AND CURETTAGE OF UTERUS       OB History   None      Home Medications    Prior to Admission medications   Medication Sig Start Date End Date Taking? Authorizing Provider  diphenhydrAMINE (BENADRYL) 25 MG tablet Take 1 tablet (25 mg total) by mouth every 6 (six) hours as needed for itching. 03/01/18   Duffy Bruce, MD  EPINEPHrine 0.3 mg/0.3 mL IJ SOAJ injection Inject 0.3 mLs (0.3 mg total) into the muscle once for 1 dose. 03/01/18 03/01/18  Duffy Bruce, MD    ferrous sulfate 325 (65 FE) MG tablet Take 1 tablet (325 mg total) by mouth daily. 01/31/18   Blanchie Dessert, MD  ibuprofen (ADVIL,MOTRIN) 600 MG tablet Take 1 tablet (600 mg total) by mouth every 6 (six) hours as needed. 09/13/17   Maczis, Barth Kirks, PA-C  IRON PO Take by mouth.    [provider]  predniSONE (DELTASONE) 50 MG tablet Take 1 tablet (50 mg total) by mouth daily for 4 days. 03/01/18 03/05/18  Duffy Bruce, MD  ranitidine (ZANTAC) 150 MG capsule Take 1 capsule (150 mg total) by mouth 2 (two) times daily for 5 days. 03/01/18 03/06/18  Duffy Bruce, MD  tranexamic acid (LYSTEDA) 650 MG TABS tablet Take 2 tablets (1,300 mg total) by mouth 3 (three) times daily for 5 days. 03/01/18 03/06/18  Duffy Bruce, MD    Family History No family history on file.  Social History Social History   Tobacco Use  . Smoking status: Never Smoker  . Smokeless tobacco: Never Used  Substance Use Topics  . Alcohol use: No  . Drug use: No     Allergies   Onion   Review of Systems Review of Systems  Constitutional: Positive for fatigue. Negative for chills and fever.  HENT: Negative for congestion and rhinorrhea.  Eyes: Negative for visual disturbance.  Respiratory: Positive for shortness of breath. Negative for cough and wheezing.   Cardiovascular: Negative for chest pain and leg swelling.  Gastrointestinal: Positive for nausea. Negative for abdominal pain, diarrhea and vomiting.  Genitourinary: Negative for dysuria and flank pain.  Musculoskeletal: Negative for neck pain and neck stiffness.  Skin: Positive for rash. Negative for wound.  Allergic/Immunologic: Negative for immunocompromised state.  Neurological: Negative for syncope, weakness and headaches.  All other systems reviewed and are negative.    Physical Exam Updated Vital Signs BP 101/63 (BP Location: Right Arm)   Pulse 87   Temp 98.2 F (36.8 C) (Oral)   Resp 18   SpO2 100%   Physical Exam   Constitutional: She is oriented to person, place, and time. She appears well-developed and well-nourished. She does not appear ill. No distress.  HENT:  Head: Normocephalic and atraumatic.  Eyes: Pupils are equal, round, and reactive to light. Conjunctivae are normal.  Neck: Neck supple.  Cardiovascular: Normal rate, regular rhythm and normal heart sounds. Exam reveals no friction rub.  No murmur heard. Pulmonary/Chest: Effort normal and breath sounds normal. No respiratory distress. She has no wheezes. She has no rales.  Abdominal: She exhibits no distension.  Musculoskeletal: She exhibits no edema.  Neurological: She is alert and oriented to person, place, and time. She exhibits normal muscle tone.  Skin: Skin is warm. Capillary refill takes less than 2 seconds.  Psychiatric: She has a normal mood and affect.  Nursing note and vitals reviewed.    ED Treatments / Results  Labs (all labs ordered are listed, but only abnormal results are displayed) Labs Reviewed  CBC WITH DIFFERENTIAL/PLATELET - Abnormal; Notable for the following components:      Result Value   Hemoglobin 7.5 (*)    HCT 25.4 (*)    MCV 60.8 (*)    MCH 17.9 (*)    MCHC 29.5 (*)    RDW 17.7 (*)    All other components within normal limits  BASIC METABOLIC PANEL  TROPONIN I    EKG EKG Interpretation  Date/Time:  Saturday March 01 2018 04:38:50 EDT Ventricular Rate:  84 PR Interval:    QRS Duration: 88 QT Interval:  350 QTC Calculation: 414 R Axis:   57 Text Interpretation:  Sinus rhythm Baseline wander in lead(s) II III aVR aVF No significant change since last tracing Confirmed by Duffy Bruce [phone]) on 03/01/2018 5:47:33 AM   Radiology Dg Chest 2 View  Result Date: 03/01/2018 CLINICAL DATA:  Allergic reaction.  Chest pain. EXAM: CHEST - 2 VIEW COMPARISON:  Chest radiograph August 07, 2017 FINDINGS: Cardiomediastinal silhouette is normal. No pleural effusions or focal consolidations. Trachea  projects midline and there is no pneumothorax. Soft tissue planes and included osseous structures are non-suspicious. IMPRESSION: Normal chest. Electronically Signed   By: Elon Alas M.D.   On: 03/01/2018 05:43    Procedures Procedures (including critical care time)  Medications Ordered in ED Medications  predniSONE (DELTASONE) tablet 60 mg (60 mg Oral Given 03/01/18 0456)  diphenhydrAMINE (BENADRYL) capsule 25 mg (25 mg Oral Given 03/01/18 0456)  famotidine (PEPCID) tablet 40 mg (40 mg Oral Given 03/01/18 0455)     Initial Impression / Assessment and Plan / ED Course  I have reviewed the triage vital signs and the nursing notes.  Pertinent labs & imaging results that were available during my care of the patient were reviewed by me and considered in my medical decision  making (see chart for details).     27 yo F here with SOB, itching after exposure to onions. I suspect pt has had anaphylactic reaction that she fortunately partially self-treated w/ epinephrine and benadryl. She has some subjective sx today but is now >24 hours post exposure with no rash, lip/tongue swelling, wheezing, or concerning sx for ongoing anaphylaxis. Given the extent of her reaction, will tx with steroids, benadryl, pepcid and monitor. Given her other complaints, will check basic labs as well though I suspect these are all 2/2 her reaction.  Labs as above. Of note, pt Hgb is 7.5. She just stopped bleeding 2 days ago after bleeding for 2-3 weeks. Hgb was near 8 two weeks ago. Had a long discussion with pt regarding this - her Transfusion threshold is >7 and given that she is HDS, o/w well appearing, and bleeding has stopped, she would like to attempt to recover w/o transfusion. Will have her increase her iron to BID and re-fill her lysteda, for DUB. Advised her ot f/u with her PCP this week for re-check and return with any symptoms of anemia.  Regarding her rxn, she has had resolution here and is well appearing  and in NAD. Will tx with antihistamines, steroids, d/c.  Final Clinical Impressions(s) / ED Diagnoses   Final diagnoses:  Allergic reaction, initial encounter  Acute on chronic anemia    ED Discharge Orders         Ordered    tranexamic acid (LYSTEDA) 650 MG TABS tablet  3 times daily     03/01/18 0633    predniSONE (DELTASONE) 50 MG tablet  Daily     03/01/18 0633    diphenhydrAMINE (BENADRYL) 25 MG tablet  Every 6 hours PRN     03/01/18 0633    ranitidine (ZANTAC) 150 MG capsule  2 times daily     03/01/18 0633    EPINEPHrine 0.3 mg/0.3 mL IJ SOAJ injection   Once     03/01/18 5449           Duffy Bruce, MD 03/01/18 [phone]

## 2018-03-01 NOTE — Discharge Instructions (Signed)
For your allergic reaction: - Take the steroids, benadryl, and zantac as prescribed - I'd recommend benadryl every 6 hours for 2 days, then only as needed - Avoid onions! - I've prescribed a new epipen  For your anemia: - Your blood level was 7.5 today - I've prescribed Lysteda as needed - Have a repeat level checked in the next week, as you may need a transfusion

## 2018-03-23 ENCOUNTER — Observation Stay (HOSPITAL_BASED_OUTPATIENT_CLINIC_OR_DEPARTMENT_OTHER)
Admission: EM | Admit: 2018-03-23 | Discharge: 2018-03-24 | Disposition: A | Discharge status: Home / Self Care | Payer: Medicaid Other | Attending: Internal Medicine | Admitting: Internal Medicine

## 2018-03-23 ENCOUNTER — Encounter (HOSPITAL_BASED_OUTPATIENT_CLINIC_OR_DEPARTMENT_OTHER): Payer: Self-pay | Admitting: *Deleted

## 2018-03-23 ENCOUNTER — Other Ambulatory Visit: Payer: Self-pay

## 2018-03-23 DIAGNOSIS — E669 Obesity, unspecified: Secondary | ICD-10-CM | POA: Insufficient documentation

## 2018-03-23 DIAGNOSIS — R197 Diarrhea, unspecified: Secondary | ICD-10-CM | POA: Insufficient documentation

## 2018-03-23 DIAGNOSIS — D62 Acute posthemorrhagic anemia: Secondary | ICD-10-CM | POA: Insufficient documentation

## 2018-03-23 DIAGNOSIS — F209 Schizophrenia, unspecified: Secondary | ICD-10-CM | POA: Insufficient documentation

## 2018-03-23 DIAGNOSIS — D649 Anemia, unspecified: Secondary | ICD-10-CM | POA: Diagnosis present

## 2018-03-23 DIAGNOSIS — N920 Excessive and frequent menstruation with regular cycle: Secondary | ICD-10-CM | POA: Diagnosis present

## 2018-03-23 DIAGNOSIS — F319 Bipolar disorder, unspecified: Secondary | ICD-10-CM | POA: Insufficient documentation

## 2018-03-23 DIAGNOSIS — D500 Iron deficiency anemia secondary to blood loss (chronic): Secondary | ICD-10-CM

## 2018-03-23 DIAGNOSIS — Z6831 Body mass index (BMI) 31.0-31.9, adult: Secondary | ICD-10-CM | POA: Insufficient documentation

## 2018-03-23 DIAGNOSIS — J45909 Unspecified asthma, uncomplicated: Secondary | ICD-10-CM | POA: Diagnosis not present

## 2018-03-23 DIAGNOSIS — Z79899 Other long term (current) drug therapy: Secondary | ICD-10-CM | POA: Diagnosis not present

## 2018-03-23 DIAGNOSIS — R05 Cough: Secondary | ICD-10-CM

## 2018-03-23 DIAGNOSIS — R059 Cough, unspecified: Secondary | ICD-10-CM

## 2018-03-23 LAB — BASIC METABOLIC PANEL
ANION GAP: 10 (ref 5–15)
BUN: 9 mg/dL (ref 6–20)
CO2: 25 mmol/L (ref 22–32)
Calcium: 8.5 mg/dL — ABNORMAL LOW (ref 8.9–10.3)
Chloride: 104 mmol/L (ref 98–111)
Creatinine, Ser: 0.83 mg/dL (ref 0.44–1.00)
GFR calc Af Amer: >60 mL/min (ref >60)
GFR calc non Af Amer: >60 mL/min (ref >60)
GLUCOSE: 124 mg/dL — AB (ref 70–99)
POTASSIUM: 3.4 mmol/L — AB (ref 3.5–5.1)
Sodium: 139 mmol/L (ref 135–145)

## 2018-03-23 LAB — CBC WITH DIFFERENTIAL/PLATELET
BASOS PCT: 0 %
Basophils Absolute: 0 10*3/uL (ref 0.0–0.1)
EOS ABS: 0.1 10*3/uL (ref 0.0–0.7)
Eosinophils Relative: 1 %
HEMATOCRIT: 23.3 % — AB (ref 36.0–46.0)
Hemoglobin: 6.7 g/dL — CL (ref 12.0–15.0)
LYMPHS ABS: 3.4 10*3/uL (ref 0.7–4.0)
Lymphocytes Relative: 42 %
MCH: 16.8 pg — ABNORMAL LOW (ref 26.0–34.0)
MCHC: 28.8 g/dL — ABNORMAL LOW (ref 30.0–36.0)
MCV: 58.5 fL — AB (ref 78.0–100.0)
MONO ABS: 0.6 10*3/uL (ref 0.1–1.0)
Monocytes Relative: 7 %
NEUTROS ABS: 3.9 10*3/uL (ref 1.7–7.7)
Neutrophils Relative %: 50 %
Platelets: 583 10*3/uL — ABNORMAL HIGH (ref 150–400)
RBC: 3.98 MIL/uL (ref 3.87–5.11)
RDW: 18.5 % — ABNORMAL HIGH (ref 11.5–15.5)
SMEAR REVIEW: INCREASED
WBC: 8 10*3/uL (ref 4.0–10.5)

## 2018-03-23 LAB — ABO/RH: ABO/RH(D): A POS

## 2018-03-23 LAB — PREPARE RBC (CROSSMATCH)

## 2018-03-23 LAB — PREGNANCY, URINE: Preg Test, Ur: NEGATIVE

## 2018-03-23 MED ORDER — FUROSEMIDE 10 MG/ML IJ SOLN
20.0000 mg | Freq: Once | INTRAMUSCULAR | Status: AC
  Administered 2018-03-23: 20 mg via INTRAVENOUS

## 2018-03-23 MED ORDER — DIPHENHYDRAMINE HCL 50 MG/ML IJ SOLN: 25.0000 mg | Freq: Once | INTRAMUSCULAR | Status: AC

## 2018-03-23 MED ORDER — ACETAMINOPHEN 325 MG PO TABS
650.0000 mg | ORAL_TABLET | Freq: Once | ORAL | Status: AC
  Administered 2018-03-23: 650 mg via ORAL
  Filled 2018-03-23: qty 2

## 2018-03-23 MED ORDER — DIPHENHYDRAMINE HCL 25 MG PO CAPS
25.0000 mg | ORAL_CAPSULE | Freq: Once | ORAL | Status: AC
  Administered 2018-03-23: 25 mg via ORAL
  Filled 2018-03-23: qty 1

## 2018-03-23 MED ORDER — SODIUM CHLORIDE 0.9 % IV SOLN
INTRAVENOUS | Status: DC
  Administered 2018-03-23 (×2): via INTRAVENOUS

## 2018-03-23 MED ORDER — SODIUM CHLORIDE 0.9% IV SOLUTION
Freq: Once | INTRAVENOUS | Status: AC
  Administered 2018-03-23: 13:00:00 via INTRAVENOUS

## 2018-03-23 MED ORDER — FUROSEMIDE 10 MG/ML IJ SOLN
20.0000 mg | Freq: Once | INTRAMUSCULAR | Status: AC
  Administered 2018-03-23: 20 mg via INTRAVENOUS
  Filled 2018-03-23: qty 2

## 2018-03-23 MED ORDER — TRANEXAMIC ACID 650 MG PO TABS
1300.0000 mg | ORAL_TABLET | Freq: Three times a day (TID) | ORAL | Status: DC
  Administered 2018-03-23 – 2018-03-24 (×4): 1300 mg via ORAL
  Filled 2018-03-23 (×6): qty 2

## 2018-03-23 MED ORDER — FUROSEMIDE 10 MG/ML IJ SOLN
INTRAMUSCULAR | Status: AC
  Administered 2018-03-23: 20 mg via INTRAVENOUS
  Filled 2018-03-23: qty 2

## 2018-03-23 MED ORDER — FAMOTIDINE 20 MG PO TABS
40.0000 mg | ORAL_TABLET | Freq: Every day | ORAL | Status: DC
  Administered 2018-03-23 – 2018-03-24 (×2): 40 mg via ORAL
  Filled 2018-03-23 (×2): qty 2

## 2018-03-23 MED ORDER — FERROUS SULFATE 325 (65 FE) MG PO TABS
325.0000 mg | ORAL_TABLET | Freq: Two times a day (BID) | ORAL | Status: DC
  Administered 2018-03-23 – 2018-03-24 (×3): 325 mg via ORAL
  Filled 2018-03-23 (×3): qty 1

## 2018-03-23 NOTE — ED Provider Notes (Signed)
[facility] DEPT [facility] Provider Note: [name], MD, FACEP  CSN: [id number] MRN: [id number] ARRIVAL: 03/23/18 at [place]: [place]  03/23/18 6:02 AM Tracey Copeland is a 27 y.o. female with dysfunctional uterine bleeding for the past 3 weeks.  This is an ongoing issue and she is being followed for this.  She has resulting iron deficiency anemia.  She is here with shortness of breath that is gotten worse over the past several days to the point where she gets winded just walking across the room.  She is concerned that her hemoglobin may have dropped (her hemoglobin was 7.5 on the seventh of this month).  She also has a nonproductive cough and a headache.  She was noted to have a low-grade fever on arrival.  She denies nausea, vomiting or diarrhea currently.  She has been using her albuterol inhaler without adequate relief of her shortness of breath.   Past Medical History:  Diagnosis Date  . Anemia   . Asthma   . Bipolar 1 disorder ([place])   . Schizophrenia [facility])     Past Surgical History:  Procedure Laterality Date  . DILATION AND CURETTAGE OF UTERUS      No family history on file.  Social History   Tobacco Use  . Smoking status: Never Smoker  . Smokeless tobacco: Never Used  Substance Use Topics  . Alcohol use: No  . Drug use: No    Prior to Admission medications   Medication Sig Start Date End Date Taking? Authorizing Provider  Barium Sulfate (EPI-C RE) Place rectally.   Yes [provider]  tranexamic acid (LYSTEDA) 650 MG TABS tablet Take 1,300 mg by mouth 3 (three) times daily.   Yes [provider]  ferrous sulfate 325 (65 FE) MG tablet Take 1 tablet (325 mg total) by mouth daily. 01/31/18   Blanchie Dessert, MD  IRON PO Take by mouth.    [provider]  ranitidine (ZANTAC) 150 MG capsule Take 1 capsule (150 mg total) by mouth 2 (two) times daily for 5  days. 03/01/18 03/06/18  Duffy Bruce, MD    Allergies Onion   REVIEW OF SYSTEMS  Negative except as noted here or in the History of Present Illness.   PHYSICAL EXAMINATION  Initial Vital Signs Height 5\' 1"  (1.549 m), weight 74.8 kg.  Examination General: Well-developed, well-nourished female in no acute distress; appearance consistent with age of record HENT: normocephalic; atraumatic Eyes: pupils equal, round and reactive to light; extraocular muscles intact Neck: supple Heart: regular rate and rhythm Lungs: clear to auscultation bilaterally; frequent cough Abdomen: soft; nondistended; nontender; bowel sounds present Extremities: No deformity; full range of motion; pulses normal Neurologic: Awake, alert and oriented; motor function intact in all extremities and symmetric; no facial droop Skin: Warm and dry Psychiatric: Normal mood and affect   RESULTS  Summary of this visit's results, reviewed by myself:   EKG Interpretation  Date/Time:    Ventricular Rate:    PR Interval:    QRS Duration:   QT Interval:    QTC Calculation:   R Axis:     Text Interpretation:        Laboratory Studies: Results for orders placed or performed during the hospital encounter of 03/23/18 (from the past 24 hour(s))  CBC with Differential/Platelet     Status: Abnormal   Collection Time: 03/23/18  6:20 AM  Result Value Ref Range  WBC 8.0 4.0 - 10.5 K/uL   RBC 3.98 3.87 - 5.11 MIL/uL   Hemoglobin 6.7 (LL) 12.0 - 15.0 g/dL   HCT 23.3 (L) 36.0 - 46.0 %   MCV 58.5 (L) 78.0 - 100.0 fL   MCH 16.8 (L) 26.0 - 34.0 pg   MCHC 28.8 (L) 30.0 - 36.0 g/dL   RDW 18.5 (H) 11.5 - 15.5 %   Platelets 583 (H) 150 - 400 K/uL   Neutrophils Relative % 50 %   Lymphocytes Relative 42 %   Monocytes Relative 7 %   Eosinophils Relative 1 %   Basophils Relative 0 %   Neutro Abs 3.9 1.7 - 7.7 K/uL   Lymphs Abs 3.4 0.7 - 4.0 K/uL   Monocytes Absolute 0.6 0.1 - 1.0 K/uL   Eosinophils Absolute 0.1 0.0 -  0.7 K/uL   Basophils Absolute 0.0 0.0 - 0.1 K/uL   RBC Morphology POLYCHROMASIA PRESENT    Smear Review PLATELETS APPEAR INCREASED   Basic metabolic panel     Status: Abnormal   Collection Time: 03/23/18  6:20 AM  Result Value Ref Range   Sodium 139 135 - 145 mmol/L   Potassium 3.4 (L) 3.5 - 5.1 mmol/L   Chloride 104 98 - 111 mmol/L   CO2 25 22 - 32 mmol/L   Glucose, Bld 124 (H) 70 - 99 mg/dL   BUN 9 6 - 20 mg/dL   Creatinine, Ser 0.83 0.44 - 1.00 mg/dL   Calcium 8.5 (L) 8.9 - 10.3 mg/dL   GFR calc non Af Amer >60 >60 mL/min   GFR calc Af Amer >60 >60 mL/min   Anion gap 10 5 - 15   Imaging Studies: No results found.  ED COURSE and MDM  Nursing notes and initial vitals signs, including pulse oximetry, reviewed.  Vitals:   03/23/18 0600 03/23/18 0607  BP:  (!) 125/100  Pulse:  (!) 104  Resp:  (!) 22  Temp:  99.9 F (37.7 C)  TempSrc:  Oral  SpO2:  100%  Weight: 74.8 kg   Height: 5\' 1"  (1.549 m)    6:50 AM Patient was previously advised to get admitted for transfusion but declined.  Since her hemoglobin has dropped she is now in agreement to having admission for transfusion for symptomatic anemia.  PROCEDURES    ED DIAGNOSES     ICD-10-CM   1. Symptomatic anemia D64.9   2. Anemia due to blood loss, chronic D50.0   3. Cough R05        Geral Tuch, Jenny Reichmann, MD 03/23/18 [phone]

## 2018-03-23 NOTE — ED Notes (Signed)
[facility] notified (Tammy) - hospitalist consult 

## 2018-03-23 NOTE — H&P (Signed)
HPI  Tracey Copeland LGX:[id number] DOB: 1991/02/07 DOA: 03/23/2018  PCP: Patient, No Pcp Per   Chief Complaint: weak  HPI:  27 year old female, LGBT, known history of menorrhagia followed by [facility] currently on tranexamic acid for the same Note multiple recent emergency visits and was seen on 8/27 for severe anemia and menorrhagia-abdomen pelvis CT at that time was normal On 9/7 went to [facility] ED-CT scan was notable for 2.5 cm hypoattenuated area of the cervix with uncertain significance-at that time she at that time determined she would follow-up with Dr. Micah Noel who apparently is her OB/GYN  Returns to med center [facility] a.m. 03/23/2018 with weakness, shortness of breath, cough,  Initial evaluation revealed a low hemoglobin of 6--tells me also has had for the past 2 days diarrhea nonbloody sitting on the toilet for about an hour in the morning with combined vaginal bleeding and loose liquidy stool This occurs occasionally with her   GYNECOLOGICAL history-menarche 12, EPimenorrhea since age 63-started on estrogen pills at that time-epi menometrorrhagia with 3 months intervals between periods of heavy bleeding and changed at age 82 to combined OCP Has never considered Mirena or other IUD with progesterone as patient wishes to have a child and undergo in vitro artificial fertilization Current menstrual cycle is every 3 to 4 months she has.  For about a month at a time without specific clots Started on tranexamic acid may be about 1 to 2 months ago after trial of last meds but it worked for week in her opinion and then it stopped working "my body got used to it"   ED Course: Obtained showing low hemoglobin saline started at 50 cc/H  Review of Systems:  + Fever, + blurred vision, + dizziness on ambulating for the past 4 days at home and felt dizzy and weak ambulating from bed to stretcher to the restroom at med center [facility] No falls + SOB, dysuria,  bleeding, Felt bloated in the truck and felt like she had gas-like pain  Past Medical History:  Diagnosis Date  . Anemia   . Asthma   . Bipolar 1 disorder ([place])   . Schizophrenia [facility])     Past Surgical History:  Procedure Laterality Date  . DILATION AND CURETTAGE OF UTERUS       reports that she has never smoked. She has never used smokeless tobacco. She reports that she does not drink alcohol or use drugs. 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 Onion Itching, Swelling and Rash    No family history on file. Grandmother and mother both had heavy periods and had hysterectomies for the same Hypertension diabetes runs in both sides of the family  Prior to Admission medications   Medication Sig Start Date End Date Taking? Authorizing Provider  Barium Sulfate (EPI-C RE) Place rectally.   Yes [provider]  tranexamic acid (LYSTEDA) 650 MG TABS tablet Take 1,300 mg by mouth 3 (three) times daily.   Yes [provider]  ferrous sulfate 325 (65 FE) MG tablet Take 1 tablet (325 mg total) by mouth daily. 01/31/18   Blanchie Dessert, MD  IRON PO Take by mouth.    [provider]  ranitidine (ZANTAC) 150 MG capsule Take 1 capsule (150 mg total) by mouth 2 (two) times daily for 5 days. 03/01/18 03/06/18  Duffy Bruce,  MD    Physical Exam:  Vitals:   03/23/18 0607 03/23/18 0828  BP: (!) 125/100 127/75  Pulse: (!) 104 95  Resp: (!) 22 18  Temp: 99.9 F (37.7 C)   SpO2: 100% 100%     Alert obese Mallampati 4 no icterus, no pallor, neck soft supple, no thyromegaly, no submandibular lymphadenopathy  Chest is clear no rales no rhonchi no TVR no TVF  S1-S2 no murmur no rub slightly tachycardic  Abdomen obese cannot appreciate hepatosplenomegaly given habitus  Neurologically intact with no  focal deficit sensory intact motor grossly intact reflexes deferred  Skin is soft supple no rash, range of motion intact with no lower extremity edema  Psych is euthymic patient very pleasant  I have personally reviewed following labs and imaging studies  Labs:   Hemoglobin 6.7 down from 7.5 which is down from prior baseline hemoglobin of on 6/19-there is polychromasia as well as thrombocytosis as well  Potassium is 3.4  Imaging studies:   none   Medical tests:   EKG independently reviewed: n    Test discussed with performing physician:  n   Decision to obtain old records:   n   Review and summation of old records:   n   Active Problems:   Menorrhagia   Assessment/Plan Severe microcytic anemia Lesion on cervix of undetermined significance-to follow-up with primary OB/GYN as outpatient LGBT-per [place] PTF specific guidelines Smoker  Acute blood loss microcytic anemia despite iron p.o. 3 times daily And has subacute epi metromenorrhagia-she is on tranexamic acid per her OB/GYN and does not wish contraception or surgical cure secondary to wishing to maintain fertility-we will continue to transfuse her over today with 3 units as she has been typed and screened she will need a morning hemoglobin I have encouraged her to speak to Dr. Micah Noel her OB/GYN tomorrow and set up an appointment to discuss the findings on her CT scan-at this time I would not subject her to another ultrasound as this can be done non-emergently  Iron is difficult to absorb orally and she will need with her level of microcytosis IV iron as an outpatient set up by her OB/GYN if she continues to bleed in this fashion  LGBT-per [place] PTF guidelines  Diarrhea-unclear if infected?  Sounds like viral-sounds like may be functional-she has no fever.  Monitor and if lactoferrin is negative consider Imodium later today  BMI 31-needs outpatient weight loss counseling and consideration for     Severity of  Illness: The appropriate patient status for this patient is OBSERVATION. Observation status is judged to be reasonable and necessary in order to provide the required intensity of service to ensure the patient's safety. The patient's presenting symptoms, physical exam findings, and initial radiographic and laboratory data in the context of their medical condition is felt to place them at decreased risk for further clinical deterioration. Furthermore, it is anticipated that the patient will be medically stable for discharge from the hospital within 2 midnights of admission. The following factors support the patient status of observation.   " The patient's presenting symptoms include vaginal bleeding. " The physical exam findings include reassuring. " The initial radiographic and laboratory data are somewhat reassuring.      CD, full code, no family at the bedside, let us expect after 3 units of blood red cells we will be able to discharge with close follow-up with Dr. Laurann Montana knows to call her primary GYN to discuss implications of above  Time spent:  41 minutes  Verlon Au, MD  [facility] Direct contact: [phone] --[place]ia [place]  --[email or web address]; password [email or web address]  7PM-7AM contact night coverage as above  03/23/2018, 8:59 AM

## 2018-03-23 NOTE — ED Triage Notes (Addendum)
C/o vaginal bleeding that started 3 weeks ago. States this is an ongoing issue and states she has her menstrual cycle for months at a time. C/o feeling sob. Concerned that her hgb is low. Also c/o of cough and a headache.

## 2018-03-23 NOTE — ED Notes (Signed)
Dr. Florina Ou aware of 6.7 hgb.

## 2018-03-24 DIAGNOSIS — D500 Iron deficiency anemia secondary to blood loss (chronic): Secondary | ICD-10-CM

## 2018-03-24 DIAGNOSIS — N921 Excessive and frequent menstruation with irregular cycle: Secondary | ICD-10-CM | POA: Diagnosis not present

## 2018-03-24 LAB — CBC WITH DIFFERENTIAL/PLATELET
BASOS PCT: 0 %
Basophils Absolute: 0 10*3/uL (ref 0.0–0.1)
EOS PCT: 1 %
Eosinophils Absolute: 0.1 10*3/uL (ref 0.0–0.7)
HEMATOCRIT: 34.1 % — AB (ref 36.0–46.0)
Hemoglobin: 10.4 g/dL — ABNORMAL LOW (ref 12.0–15.0)
LYMPHS ABS: 3.9 10*3/uL (ref 0.7–4.0)
Lymphocytes Relative: 43 %
MCH: 20.1 pg — ABNORMAL LOW (ref 26.0–34.0)
MCHC: 30.5 g/dL (ref 30.0–36.0)
MCV: 65.8 fL — AB (ref 78.0–100.0)
MONOS PCT: 6 %
Monocytes Absolute: 0.5 10*3/uL (ref 0.1–1.0)
NEUTROS ABS: 4.6 10*3/uL (ref 1.7–7.7)
Neutrophils Relative %: 50 %
Platelets: 497 10*3/uL — ABNORMAL HIGH (ref 150–400)
RBC: 5.18 MIL/uL — AB (ref 3.87–5.11)
RDW: 22.7 % — AB (ref 11.5–15.5)
WBC: 9.1 10*3/uL (ref 4.0–10.5)

## 2018-03-24 LAB — TYPE AND SCREEN
ABO/RH(D): A POS
Antibody Screen: NEGATIVE
UNIT DIVISION: 0
UNIT DIVISION: 0
Unit division: 0

## 2018-03-24 LAB — BPAM RBC
Blood Product Expiration Date: 201910032359
Blood Product Expiration Date: 201910032359
Blood Product Expiration Date: 201910042359
ISSUE DATE / TIME: 201909151241
ISSUE DATE / TIME: 201909151717
ISSUE DATE / TIME: 201909152015
UNIT TYPE AND RH: 6200
Unit Type and Rh: 6200
Unit Type and Rh: 6200

## 2018-03-24 MED ORDER — FERROUS SULFATE 325 (65 FE) MG PO TABS: 325.0000 mg | ORAL_TABLET | Freq: Three times a day (TID) | ORAL | Status: DC

## 2018-03-24 NOTE — Discharge Summary (Signed)
Physician Discharge Summary  Tracey Copeland KDT:[id number] DOB: 1990/12/10 DOA: 03/23/2018  PCP: Patient, No Pcp Per  Admit date: 03/23/2018 Discharge date: 03/24/2018  Admitted From: Home Disposition: Home  Recommendations for Outpatient Follow-up:  1. Follow up with gynecologist at earliest [place]: No Equipment/Devices: None  Discharge Condition: Stable CODE STATUS: Full Diet recommendation: Regular   Brief/Interim Summary: 27 year old female with history of menorrhagia followed by OB/GYN and multiple emergency room visits for severe anemia presented with complaints of weakness.  She was found to have hemoglobin of 6.7.  She was transfused 2 units of packed red cells.  Her hemoglobin is stable.  She will be discharged home with outpatient follow-up with gynecology at earliest convenience.  Discharge Diagnoses:  Active Problems:   Menorrhagia   Anemia  Acute blood loss anemia secondary to menorrhagia -Status post 3 units packed red cells transfusion.  Hemoglobin is 10.4 this morning.  Patient will need to follow-up with her gynecologist at earliest convenience -Can continue oral iron supplementation.  Discharge patient home  Discharge Instructions  Discharge Instructions    Ambulatory referral to Gynecology   Complete by:  As directed    Menorrhagia   Call MD for:  extreme fatigue   Complete by:  As directed    Call MD for:  persistant dizziness or light-headedness   Complete by:  As directed    Call MD for:  severe uncontrolled pain   Complete by:  As directed    Diet general   Complete by:  As directed    Increase activity slowly   Complete by:  As directed      Allergies as of 03/24/2018      Reactions   Onion Itching, Swelling, Rash      Medication List    STOP taking these medications   ibuprofen 600 MG tablet Commonly known as:  ADVIL,MOTRIN     TAKE these medications   acetaminophen 500 MG tablet Commonly known as:   TYLENOL Take 1,000 mg by mouth every 6 (six) hours as needed for moderate pain or headache.   albuterol 108 (90 Base) MCG/ACT inhaler Commonly known as:  PROVENTIL HFA;VENTOLIN HFA Inhale 2 puffs into the lungs every 6 (six) hours as needed for wheezing or shortness of breath.   diphenhydrAMINE 25 mg capsule Commonly known as:  BENADRYL Take 25 mg by mouth every 6 (six) hours as needed for itching. What changed:  Another medication with the same name was removed. Continue taking this medication, and follow the directions you see here.   EPIPEN 2-PAK 0.3 mg/0.3 mL Soaj injection Generic drug:  EPINEPHrine Inject 0.3 mg into the muscle once.   ferrous sulfate 325 (65 FE) MG tablet Take 1 tablet (325 mg total) by mouth 3 (three) times daily with meals.   ONE-A-DAY WOMENS PO Take 1 tablet by mouth daily.   ranitidine 150 MG capsule Commonly known as:  ZANTAC Take 1 capsule (150 mg total) by mouth 2 (two) times daily for 5 days. What changed:  when to take this   tranexamic acid 650 MG Tabs tablet Commonly known as:  LYSTEDA Take 650 mg by mouth 2 (two) times daily.      Follow-up Information    Gynecologist Follow up.   Why:  at earliest convenience         Allergies  Allergen Reactions  . Onion Itching, Swelling and Rash    Consultations:  None   Procedures/Studies: Dg Chest 2 View  Result Date: 03/01/2018 CLINICAL DATA:  Allergic reaction.  Chest pain. EXAM: CHEST - 2 VIEW COMPARISON:  Chest radiograph August 07, 2017 FINDINGS: Cardiomediastinal silhouette is normal. No pleural effusions or focal consolidations. Trachea projects midline and there is no pneumothorax. Soft tissue planes and included osseous structures are non-suspicious. IMPRESSION: Normal chest. Electronically Signed   By: Elon Alas M.D.   On: 03/01/2018 05:43      Subjective: Patient seen and examined at bedside.  She denies any overnight fever, nausea, vomiting.  Feels better enough  and is okay to go home.  Discharge Exam: Vitals:   03/23/18 2328 03/24/18 0637  BP: 122/78 111/69  Pulse: 75 81  Resp: 18 18  Temp: 98.2 F (36.8 C) 98.6 F (37 C)  SpO2: 99% 100%   Vitals:   03/23/18 1955 03/23/18 2037 03/23/18 2328 03/24/18 0637  BP: (!) 128/93 110/71 122/78 111/69  Pulse: 87 72 75 81  Resp: 18 18 18 18   Temp: 97.7 F (36.5 C) 98.2 F (36.8 C) 98.2 F (36.8 C) 98.6 F (37 C)  TempSrc: Axillary Oral Oral Oral  SpO2: 100% 100% 99% 100%  Weight:      Height:        General: Pt is alert, awake, not in acute distress Cardiovascular: rate controlled, S1/S2 + Respiratory: bilateral decreased breath sounds at bases Abdominal: Soft, NT, ND, bowel sounds + Extremities: no edema, no cyanosis    The results of significant diagnostics from this hospitalization (including imaging, microbiology, ancillary and laboratory) are listed below for reference.     Microbiology: No results found for this or any previous visit (from the past 240 hour(s)).   Labs: BNP (last 3 results) No results for input(s): BNP in the last 8760 hours. Basic Metabolic Panel: Recent Labs  Lab 03/23/18 0620  NA 139  K 3.4*  CL 104  CO2 25  GLUCOSE 124*  BUN 9  CREATININE 0.83  CALCIUM 8.5*   Liver Function Tests: No results for input(s): AST, ALT, ALKPHOS, BILITOT, PROT, ALBUMIN in the last 168 hours. No results for input(s): LIPASE, AMYLASE in the last 168 hours. No results for input(s): AMMONIA in the last 168 hours. CBC: Recent Labs  Lab 03/23/18 0620 03/24/18 0401  WBC 8.0 9.1  NEUTROABS 3.9 4.6  HGB 6.7* 10.4*  HCT 23.3* 34.1*  MCV 58.5* 65.8*  PLT 583* 497*   Cardiac Enzymes: No results for input(s): CKTOTAL, CKMB, CKMBINDEX, TROPONINI in the last 168 hours. BNP: Invalid input(s): POCBNP CBG: No results for input(s): GLUCAP in the last 168 hours. D-Dimer No results for input(s): DDIMER in the last 72 hours. Hgb A1c No results for input(s): HGBA1C in  the last 72 hours. Lipid Profile No results for input(s): CHOL, HDL, LDLCALC, TRIG, CHOLHDL, LDLDIRECT in the last 72 hours. Thyroid function studies No results for input(s): TSH, T4TOTAL, T3FREE, THYROIDAB in the last 72 hours.  Invalid input(s): FREET3 Anemia work up No results for input(s): VITAMINB12, FOLATE, FERRITIN, TIBC, IRON, RETICCTPCT in the last 72 hours. Urinalysis    Component Value Date/Time   COLORURINE YELLOW 11/26/2016 2254   APPEARANCEUR CLEAR 11/26/2016 2254   LABSPEC 1.014 11/26/2016 2254   PHURINE 5.5 11/26/2016 2254   GLUCOSEU NEGATIVE 11/26/2016 2254   HGBUR LARGE (A) 11/26/2016 2254   BILIRUBINUR NEGATIVE 11/26/2016 2254   KETONESUR NEGATIVE 11/26/2016 2254   PROTEINUR NEGATIVE 11/26/2016 2254   UROBILINOGEN 1.0 09/17/2014 1950   NITRITE NEGATIVE 11/26/2016 2254   LEUKOCYTESUR NEGATIVE 11/26/2016  2254   Sepsis Labs Invalid input(s): PROCALCITONIN,  WBC,  LACTICIDVEN Microbiology No results found for this or any previous visit (from the past 240 hour(s)).   Time coordinating discharge: 35 minutes  SIGNED:   Aline August, MD  [facility] 03/24/2018, 9:52 AM Pager: [phone]  If 7PM-7AM, please contact night-coverage [email or web address] Password [email or web address]

## 2018-03-24 NOTE — Progress Notes (Signed)
Discharge instructions given to pt and all questions were answered. Pt taken out via wheelchair and was picked up by her wife.

## 2018-05-05 ENCOUNTER — Other Ambulatory Visit: Payer: Self-pay

## 2018-05-05 ENCOUNTER — Emergency Department (HOSPITAL_BASED_OUTPATIENT_CLINIC_OR_DEPARTMENT_OTHER)
Admission: EM | Admit: 2018-05-05 | Discharge: 2018-05-06 | Disposition: A | Discharge status: Home / Self Care | Payer: Medicaid Other | Attending: Emergency Medicine | Admitting: Emergency Medicine

## 2018-05-05 ENCOUNTER — Encounter (HOSPITAL_BASED_OUTPATIENT_CLINIC_OR_DEPARTMENT_OTHER): Payer: Self-pay

## 2018-05-05 DIAGNOSIS — Z79899 Other long term (current) drug therapy: Secondary | ICD-10-CM | POA: Diagnosis not present

## 2018-05-05 DIAGNOSIS — J45909 Unspecified asthma, uncomplicated: Secondary | ICD-10-CM | POA: Diagnosis not present

## 2018-05-05 DIAGNOSIS — L02213 Cutaneous abscess of chest wall: Secondary | ICD-10-CM | POA: Diagnosis present

## 2018-05-05 NOTE — ED Triage Notes (Signed)
Pt c/o what sounds like an abscess to right breast for the last few days, denies fevers at home

## 2018-05-06 MED ORDER — LIDOCAINE HCL (PF) 1 % IJ SOLN
5.0000 mL | Freq: Once | INTRAMUSCULAR | Status: AC
  Administered 2018-05-06: 5 mL
  Filled 2018-05-06: qty 5

## 2018-05-06 NOTE — Discharge Instructions (Addendum)
Take ibuprofen or acetaminophen as needed for pain.

## 2018-05-06 NOTE — ED Provider Notes (Signed)
[facility] EMERGENCY DEPARTMENT Provider Note   CSN: [id number] Arrival date & time: 05/05/18  2345     History   Chief Complaint Chief Complaint  Patient presents with  . Abscess    HPI Tracey Copeland is a 27 y.o. female.  The history is provided by the patient.  She has history of asthma, anemia, bipolar disorder and comes in complaining of a painful knot on the medial aspect of her right breast.  It has been present for 2 days and has been getting worse.  She tried to squeeze the pus out of it, but got some thick material.  She rates pain at 8/10.  She denies fever or chills.    Past Medical History:  Diagnosis Date  . Anemia   . Asthma   . Bipolar 1 disorder ([place])   . Schizophrenia [facility])     Patient Active Problem List   Diagnosis Date Noted  . Menorrhagia 03/23/2018  . Anemia 03/23/2018    Past Surgical History:  Procedure Laterality Date  . DILATION AND CURETTAGE OF UTERUS       OB History   None      Home Medications    Prior to Admission medications   Medication Sig Start Date End Date Taking? Authorizing Provider  acetaminophen (TYLENOL) 500 MG tablet Take 1,000 mg by mouth every 6 (six) hours as needed for moderate pain or headache.    [provider]  albuterol (PROVENTIL HFA;VENTOLIN HFA) 108 (90 Base) MCG/ACT inhaler Inhale 2 puffs into the lungs every 6 (six) hours as needed for wheezing or shortness of breath.    [provider]  diphenhydrAMINE (BENADRYL) 25 mg capsule Take 25 mg by mouth every 6 (six) hours as needed for itching.    [provider]  EPINEPHrine (EPIPEN 2-PAK) 0.3 mg/0.3 mL IJ SOAJ injection Inject 0.3 mg into the muscle once.    [provider]  ferrous sulfate 325 (65 FE) MG tablet Take 1 tablet (325 mg total) by mouth 3 (three) times daily with meals. 03/24/18   Aline August, MD  Multiple Vitamins-Calcium (ONE-A-DAY WOMENS PO) Take 1 tablet by mouth daily.    [provider]  ranitidine (ZANTAC) 150 MG capsule Take 1 capsule (150 mg total) by mouth 2 (two) times daily for 5 days. Patient taking differently: Take 150 mg by mouth daily.  03/01/18 03/23/18  Duffy Bruce, MD  tranexamic acid (LYSTEDA) 650 MG TABS tablet Take 650 mg by mouth 2 (two) times daily.     [provider]    Family History No family history on file.  Social History Social History   Tobacco Use  . Smoking status: Never Smoker  . Smokeless tobacco: Never Used  Substance Use Topics  . Alcohol use: No  . Drug use: No     Allergies   Onion   Review of Systems Review of Systems  All other systems reviewed and are negative.    Physical Exam Updated Vital Signs BP 114/69 (BP Location: Left Arm)   Pulse 95   Temp 98.4 F (36.9 C) (Oral)   Resp 16   Ht 5\' 1"  (1.549 m)   Wt 105.2 kg   LMP 02/23/2018 Comment: states her period just ended 05/04/18, has abnormal uterine bleeding  SpO2 96%   BMI 43.84 kg/m   Physical Exam  Nursing note and vitals reviewed.  27 year old female, resting comfortably and in no acute distress. Vital signs are normal.  Oxygen saturation is 96%, which is normal. Head is normocephalic and atraumatic. PERRLA, EOMI. Oropharynx is clear. Neck is nontender and supple without adenopathy or JVD. Back is nontender and there is no CVA tenderness. Lungs are clear without rales, wheezes, or rhonchi. Chest is nontender.  Tender, indurated lesion in the medial aspect of the right breast near the sternum.  Induration measures 1 cm x 1 cm.  No overlying erythema or warmth. Heart has regular rate and rhythm without murmur. Abdomen is soft, flat, nontender without masses or hepatosplenomegaly and peristalsis is normoactive. Extremities have no cyanosis or edema, full range of motion is present. Skin is warm and dry without rash. Neurologic: Mental status is normal, cranial nerves are intact, there are no motor or sensory deficits.  ED  Treatments / Results   Procedures .Marland KitchenIncision and Drainage Date/Time: 05/06/2018 1:47 AM Performed by: Delora Fuel, MD Authorized by: Delora Fuel, MD   Consent:    Consent obtained:  Verbal   Consent given by:  Patient   Risks discussed:  Bleeding, pain and incomplete drainage   Alternatives discussed:  No treatment Location:    Type:  Abscess   Size:  1cmx1cm   Location:  Trunk   Trunk location:  Chest Pre-procedure details:    Skin preparation:  Chloraprep Anesthesia (see MAR for exact dosages):    Anesthesia method:  Local infiltration   Local anesthetic:  Lidocaine 1% w/o epi Procedure type:    Complexity:  Simple Procedure details:    Needle aspiration: no     Incision types:  Single straight   Incision depth:  Subcutaneous   Scalpel blade:  11   Drainage:  Purulent   Drainage amount:  Moderate   Packing materials:  None Post-procedure details:    Patient tolerance of procedure:  Tolerated well, no immediate complications    Medications Ordered in ED Medications  lidocaine (PF) (XYLOCAINE) 1 % injection 5 mL (5 mLs Infiltration Given 05/06/18 0059)     Initial Impression / Assessment and Plan / ED Course  I have reviewed the triage vital signs and the nursing notes.  Pertinent labs & imaging results that were available during my care of the patient were reviewed by me and considered in my medical decision making (see chart for details).  Inclusion cyst of the chest wall treated with incision and drainage.  Old records are reviewed, and she has no relevant past visits.  No indication for ongoing antibiotics.  Final Clinical Impressions(s) / ED Diagnoses   Final diagnoses:  Abscess of parasternal chest wall    ED Discharge Orders    None       Delora Fuel, MD 62/26/33 0150

## 2018-05-06 NOTE — ED Notes (Signed)
Pt c/o "bump" between breast x 3 days  States went down some after hot shower

## 2018-05-12 ENCOUNTER — Other Ambulatory Visit: Payer: Self-pay

## 2018-05-12 ENCOUNTER — Emergency Department (HOSPITAL_BASED_OUTPATIENT_CLINIC_OR_DEPARTMENT_OTHER)
Admission: EM | Admit: 2018-05-12 | Discharge: 2018-05-12 | Disposition: A | Discharge status: Home / Self Care | Payer: Medicaid Other | Attending: Emergency Medicine | Admitting: Emergency Medicine

## 2018-05-12 ENCOUNTER — Encounter (HOSPITAL_BASED_OUTPATIENT_CLINIC_OR_DEPARTMENT_OTHER): Payer: Self-pay | Admitting: Emergency Medicine

## 2018-05-12 DIAGNOSIS — Z79899 Other long term (current) drug therapy: Secondary | ICD-10-CM | POA: Insufficient documentation

## 2018-05-12 DIAGNOSIS — A059 Bacterial foodborne intoxication, unspecified: Secondary | ICD-10-CM | POA: Diagnosis not present

## 2018-05-12 DIAGNOSIS — J45909 Unspecified asthma, uncomplicated: Secondary | ICD-10-CM | POA: Insufficient documentation

## 2018-05-12 DIAGNOSIS — R197 Diarrhea, unspecified: Secondary | ICD-10-CM

## 2018-05-12 DIAGNOSIS — F209 Schizophrenia, unspecified: Secondary | ICD-10-CM | POA: Insufficient documentation

## 2018-05-12 DIAGNOSIS — R112 Nausea with vomiting, unspecified: Secondary | ICD-10-CM

## 2018-05-12 DIAGNOSIS — F319 Bipolar disorder, unspecified: Secondary | ICD-10-CM | POA: Insufficient documentation

## 2018-05-12 DIAGNOSIS — R1110 Vomiting, unspecified: Secondary | ICD-10-CM | POA: Diagnosis present

## 2018-05-12 MED ORDER — LOPERAMIDE HCL 2 MG PO CAPS: 4.0000 mg | ORAL_CAPSULE | Freq: Every evening | ORAL | 0 refills | Status: DC | PRN

## 2018-05-12 MED ORDER — ONDANSETRON 8 MG PO TBDP: 8.0000 mg | ORAL_TABLET | Freq: Three times a day (TID) | ORAL | 0 refills | Status: DC | PRN

## 2018-05-12 MED ORDER — ONDANSETRON 8 MG PO TBDP
8.0000 mg | ORAL_TABLET | Freq: Once | ORAL | Status: AC
  Administered 2018-05-12: 8 mg via ORAL
  Filled 2018-05-12: qty 1

## 2018-05-12 MED ORDER — PROMETHAZINE HCL 25 MG RE SUPP: 25.0000 mg | Freq: Four times a day (QID) | RECTAL | 0 refills | Status: DC | PRN

## 2018-05-12 NOTE — ED Triage Notes (Signed)
Reports vomiting and diarrhea since yesterday.  Denies abdominal pain.  4 episodes of vomiting and 5 episodes of diarrhea.

## 2018-05-12 NOTE — ED Notes (Signed)
Pt now rates nausea 4/10 from 8/10.

## 2018-05-12 NOTE — ED Provider Notes (Signed)
[facility] EMERGENCY DEPARTMENT Provider Note   CSN: [id number] Arrival date & time: 05/12/18  0347     History   Chief Complaint Chief Complaint  Patient presents with  . Emesis  . Diarrhea    HPI Tracey Copeland is a 27 y.o. female.  HPI  27 year old female comes in with chief complaint of vomiting and diarrhea.  She has history of bipolar disorder and asthma.  Patient states that after eating at a fast food chain yesterday for lunch, she started feeling slightly unwell.  At nighttime she started developing nausea with vomiting and subsequently started having diarrhea.  Patient has had about 5 episodes of nonbilious emesis and 5 episodes of loose stools.  Patient denies any abdominal pain.  Last episode of emesis was around 7 AM.  Past Medical History:  Diagnosis Date  . Anemia   . Asthma   . Bipolar 1 disorder ([place])   . Schizophrenia [facility])     Patient Active Problem List   Diagnosis Date Noted  . Menorrhagia 03/23/2018  . Anemia 03/23/2018    Past Surgical History:  Procedure Laterality Date  . DILATION AND CURETTAGE OF UTERUS       OB History   None      Home Medications    Prior to Admission medications   Medication Sig Start Date End Date Taking? Authorizing Provider  acetaminophen (TYLENOL) 500 MG tablet Take 1,000 mg by mouth every 6 (six) hours as needed for moderate pain or headache.    [provider]  albuterol (PROVENTIL HFA;VENTOLIN HFA) 108 (90 Base) MCG/ACT inhaler Inhale 2 puffs into the lungs every 6 (six) hours as needed for wheezing or shortness of breath.    [provider]  diphenhydrAMINE (BENADRYL) 25 mg capsule Take 25 mg by mouth every 6 (six) hours as needed for itching.    [provider]  EPINEPHrine (EPIPEN 2-PAK) 0.3 mg/0.3 mL IJ SOAJ injection Inject 0.3 mg into the muscle once.    [provider]  ferrous sulfate 325 (65 FE) MG tablet Take 1 tablet (325 mg total) by mouth 3  (three) times daily with meals. 03/24/18   Aline August, MD  loperamide (IMODIUM) 2 MG capsule Take 2 capsules (4 mg total) by mouth at bedtime as needed for diarrhea or loose stools. 05/12/18   Varney Biles, MD  Multiple Vitamins-Calcium (ONE-A-DAY WOMENS PO) Take 1 tablet by mouth daily.    [provider]  ondansetron (ZOFRAN ODT) 8 MG disintegrating tablet Take 1 tablet (8 mg total) by mouth every 8 (eight) hours as needed for nausea. 05/12/18   Varney Biles, MD  promethazine (PHENERGAN) 25 MG suppository Place 1 suppository (25 mg total) rectally every 6 (six) hours as needed for nausea. 05/12/18   Varney Biles, MD  ranitidine (ZANTAC) 150 MG capsule Take 1 capsule (150 mg total) by mouth 2 (two) times daily for 5 days. Patient taking differently: Take 150 mg by mouth daily.  03/01/18 03/23/18  Duffy Bruce, MD  tranexamic acid (LYSTEDA) 650 MG TABS tablet Take 650 mg by mouth 2 (two) times daily.     [provider]    Family History History reviewed. No pertinent family history.  Social History Social History   Tobacco Use  . Smoking status: Never Smoker  . Smokeless tobacco: Never Used  Substance Use Topics  . Alcohol use: No  . Drug use: No     Allergies   Onion   Review of Systems  Review of Systems  Constitutional: Positive for activity change.  Gastrointestinal: Positive for nausea and vomiting. Negative for abdominal pain.  Allergic/Immunologic: Negative for immunocompromised state.  Neurological: Positive for dizziness.  Hematological: Does not bruise/bleed easily.     Physical Exam Updated Vital Signs BP 111/71 (BP Location: Right Arm)   Pulse 75   Temp 98.7 F (37.1 C) (Oral)   Resp 18   Ht 5\' 1"  (1.549 m)   Wt 114.3 kg   LMP 05/04/2018 (Approximate)   SpO2 100%   BMI 47.61 kg/m   Physical Exam  Constitutional: She is oriented to person, place, and time. She appears well-developed.  HENT:  Head: Normocephalic and  atraumatic.  Eyes: EOM are normal.  Neck: Neck supple.  Cardiovascular: Normal rate.  Pulmonary/Chest: Effort normal.  Abdominal: Bowel sounds are normal.  Neurological: She is alert and oriented to person, place, and time.  Skin: Skin is warm and dry. Capillary refill takes less than 2 seconds.  Nursing note and vitals reviewed.    ED Treatments / Results  Labs (all labs ordered are listed, but only abnormal results are displayed) Labs Reviewed - No data to display  EKG None  Radiology No results found.  Procedures Procedures (including critical care time)  Medications Ordered in ED Medications  ondansetron (ZOFRAN-ODT) disintegrating tablet 8 mg (8 mg Oral Given 05/12/18 0347)     Initial Impression / Assessment and Plan / ED Course  I have reviewed the triage vital signs and the nursing notes.  Pertinent labs & imaging results that were available during my care of the patient were reviewed by me and considered in my medical decision making (see chart for details).  Clinical Course as of May 13 1031  Mon May 12, 2018  1032 Pt reassessed. Pt's VSS and WNL. Pt's cap refill < 3 seconds. Pt has been hydrated in the ER and now passed po challenge. We will discharge with antiemetic. Strict ER return precautions have been discussed and pt will return if he is unable to tolerate fluids and symptoms are getting worse.    [AN]    Clinical Course User Index [AN] Varney Biles, MD    Patient comes in with chief complaint of nausea, vomiting, diarrhea. It seems like patient symptoms started few hours after she had solid.  Her stepson who had the same salad also is sick and in the emergency room right now with same complaints.  There is no abdominal pain, patient is not toxic appearing, she does not appear dehydrated and there is no fevers.  We will start oral challenge and reassess.  Final Clinical Impressions(s) / ED Diagnoses   Final diagnoses:  Nausea vomiting and  diarrhea  FP (food poisoning)    ED Discharge Orders         Ordered    ondansetron (ZOFRAN ODT) 8 MG disintegrating tablet  Every 8 hours PRN     05/12/18 1022    promethazine (PHENERGAN) 25 MG suppository  Every 6 hours PRN     05/12/18 1022    loperamide (IMODIUM) 2 MG capsule  At bedtime PRN     05/12/18 1022           Varney Biles, MD 05/12/18 1032

## 2018-05-12 NOTE — Discharge Instructions (Addendum)
We suspect that your symptoms are because of food poisoning. Please take the nausea medications as prescribed.  We recommend that you take in clear liquid diet for the next 2-3 days. Use the diarrhea medicine only at nighttime for nighttime diarrhea.

## 2018-05-12 NOTE — ED Notes (Signed)
8/10 nausea

## 2018-07-14 ENCOUNTER — Other Ambulatory Visit: Payer: Self-pay

## 2018-07-14 ENCOUNTER — Emergency Department (HOSPITAL_BASED_OUTPATIENT_CLINIC_OR_DEPARTMENT_OTHER)
Admission: EM | Admit: 2018-07-14 | Discharge: 2018-07-14 | Disposition: A | Discharge status: Home / Self Care | Payer: Medicaid Other | Attending: Emergency Medicine | Admitting: Emergency Medicine

## 2018-07-14 ENCOUNTER — Encounter (HOSPITAL_BASED_OUTPATIENT_CLINIC_OR_DEPARTMENT_OTHER): Payer: Self-pay | Admitting: Emergency Medicine

## 2018-07-14 DIAGNOSIS — D500 Iron deficiency anemia secondary to blood loss (chronic): Secondary | ICD-10-CM

## 2018-07-14 DIAGNOSIS — F209 Schizophrenia, unspecified: Secondary | ICD-10-CM | POA: Insufficient documentation

## 2018-07-14 DIAGNOSIS — J45909 Unspecified asthma, uncomplicated: Secondary | ICD-10-CM | POA: Diagnosis not present

## 2018-07-14 DIAGNOSIS — R0602 Shortness of breath: Secondary | ICD-10-CM | POA: Diagnosis present

## 2018-07-14 DIAGNOSIS — F319 Bipolar disorder, unspecified: Secondary | ICD-10-CM | POA: Insufficient documentation

## 2018-07-14 DIAGNOSIS — T782XXA Anaphylactic shock, unspecified, initial encounter: Secondary | ICD-10-CM | POA: Diagnosis not present

## 2018-07-14 DIAGNOSIS — Z79899 Other long term (current) drug therapy: Secondary | ICD-10-CM | POA: Insufficient documentation

## 2018-07-14 LAB — TROPONIN I: Troponin I: <0.03 ng/mL (ref <0.03)

## 2018-07-14 LAB — BASIC METABOLIC PANEL
Anion gap: 6 (ref 5–15)
BUN: 11 mg/dL (ref 6–20)
CHLORIDE: 105 mmol/L (ref 98–111)
CO2: 24 mmol/L (ref 22–32)
CREATININE: 0.83 mg/dL (ref 0.44–1.00)
Calcium: 8.7 mg/dL — ABNORMAL LOW (ref 8.9–10.3)
GFR calc Af Amer: >60 mL/min (ref >60)
GFR calc non Af Amer: >60 mL/min (ref >60)
GLUCOSE: 99 mg/dL (ref 70–99)
POTASSIUM: 3.4 mmol/L — AB (ref 3.5–5.1)
Sodium: 135 mmol/L (ref 135–145)

## 2018-07-14 LAB — CBC WITH DIFFERENTIAL/PLATELET
ABS IMMATURE GRANULOCYTES: 0.01 10*3/uL (ref 0.00–0.07)
BASOS PCT: 0 %
Basophils Absolute: 0 10*3/uL (ref 0.0–0.1)
EOS ABS: 0.1 10*3/uL (ref 0.0–0.5)
Eosinophils Relative: 1 %
HCT: 27.3 % — ABNORMAL LOW (ref 36.0–46.0)
Hemoglobin: 7.1 g/dL — ABNORMAL LOW (ref 12.0–15.0)
IMMATURE GRANULOCYTES: 0 %
Lymphocytes Relative: 37 %
Lymphs Abs: 2.7 10*3/uL (ref 0.7–4.0)
MCH: 15.4 pg — AB (ref 26.0–34.0)
MCHC: 26 g/dL — AB (ref 30.0–36.0)
MCV: 59.2 fL — AB (ref 80.0–100.0)
MONO ABS: 0.4 10*3/uL (ref 0.1–1.0)
MONOS PCT: 5 %
NEUTROS ABS: 4.2 10*3/uL (ref 1.7–7.7)
Neutrophils Relative %: 57 %
PLATELETS: 411 10*3/uL — AB (ref 150–400)
RBC: 4.61 MIL/uL (ref 3.87–5.11)
RDW: 20.2 % — ABNORMAL HIGH (ref 11.5–15.5)
Smear Review: NORMAL
WBC: 7.5 10*3/uL (ref 4.0–10.5)
nRBC: 0 % (ref 0.0–0.2)

## 2018-07-14 MED ORDER — DIPHENHYDRAMINE HCL 50 MG/ML IJ SOLN
25.0000 mg | Freq: Once | INTRAMUSCULAR | Status: AC
  Administered 2018-07-14: 25 mg via INTRAVENOUS
  Filled 2018-07-14: qty 1

## 2018-07-14 MED ORDER — METHYLPREDNISOLONE SODIUM SUCC 125 MG IJ SOLR
125.0000 mg | Freq: Once | INTRAMUSCULAR | Status: AC
  Administered 2018-07-14: 125 mg via INTRAVENOUS
  Filled 2018-07-14: qty 2

## 2018-07-14 MED ORDER — RANITIDINE HCL 150 MG PO CAPS: 150.0000 mg | ORAL_CAPSULE | Freq: Two times a day (BID) | ORAL | 0 refills | Status: DC

## 2018-07-14 MED ORDER — PREDNISONE 50 MG PO TABS: ORAL_TABLET | ORAL | 0 refills | Status: DC

## 2018-07-14 MED ORDER — EPINEPHRINE 0.3 MG/0.3ML IJ SOAJ: 0.3000 mg | Freq: Once | INTRAMUSCULAR | 0 refills | Status: AC

## 2018-07-14 MED ORDER — FAMOTIDINE IN NACL 20-0.9 MG/50ML-% IV SOLN
20.0000 mg | Freq: Once | INTRAVENOUS | Status: AC
  Administered 2018-07-14: 20 mg via INTRAVENOUS
  Filled 2018-07-14: qty 50

## 2018-07-14 NOTE — Discharge Instructions (Signed)
Take the steroids as prescribed.  Use Benadryl as needed for itching.  Use the epinephrine pen if you have difficulty breathing or difficulty swallowing, chest pain or shortness of breath.  If you use the epinephrine pen, you must come to the emergency department.  You declined blood transfusion today.  Return to the ED with difficulty breathing, chest pain, shortness of breath, throat tightness or any other concerns.

## 2018-07-14 NOTE — ED Provider Notes (Signed)
[place] EMERGENCY DEPARTMENT Provider Note   CSN: [id number] Arrival date & time: 07/14/18  0257     History   Chief Complaint Chief Complaint  Patient presents with  . Allergic Reaction    HPI Tracey Copeland is a 28 y.o. female.  Patient reports having allergic reaction to onions.  States she accidentally ate onions at a [employer] hamburger about 45 minutes ago.  She started to feel tightness and swelling in her throat accompanied by shortness of breath and chest pain with coughing.  She also developed some swelling in her face per her wife at bedside.  She believes she had a rash and was itching all over.  She gave herself her EpiPen around 3 AM.  She could not find the Benadryl so she came to the hospital.  Has had similar reactions in the past from onions.  Denies any other exposures.  She feels her throat still feels tight but is improving.  She still feels short of breath with coughing. She has chest pain with coughing only.  Notably she was seen at [facility] yesterday for flulike symptoms and started on Tamiflu.   The history is provided by the patient.  Allergic Reaction  Presenting symptoms: difficulty swallowing and rash     Past Medical History:  Diagnosis Date  . Anemia   . Asthma   . Bipolar 1 disorder ([place])   . Schizophrenia [facility])     Patient Active Problem List   Diagnosis Date Noted  . Menorrhagia 03/23/2018  . Anemia 03/23/2018    Past Surgical History:  Procedure Laterality Date  . DILATION AND CURETTAGE OF UTERUS       OB History   No obstetric history on file.      Home Medications    Prior to Admission medications   Medication Sig Start Date End Date Taking? Authorizing Provider  acetaminophen (TYLENOL) 500 MG tablet Take 1,000 mg by mouth every 6 (six) hours as needed for moderate pain or headache.    [provider]  albuterol (PROVENTIL HFA;VENTOLIN HFA) 108 (90 Base) MCG/ACT inhaler Inhale 2  puffs into the lungs every 6 (six) hours as needed for wheezing or shortness of breath.    [provider]  diphenhydrAMINE (BENADRYL) 25 mg capsule Take 25 mg by mouth every 6 (six) hours as needed for itching.    [provider]  EPINEPHrine (EPIPEN 2-PAK) 0.3 mg/0.3 mL IJ SOAJ injection Inject 0.3 mg into the muscle once.    [provider]  ferrous sulfate 325 (65 FE) MG tablet Take 1 tablet (325 mg total) by mouth 3 (three) times daily with meals. 03/24/18   Aline August, MD  loperamide (IMODIUM) 2 MG capsule Take 2 capsules (4 mg total) by mouth at bedtime as needed for diarrhea or loose stools. 05/12/18   Varney Biles, MD  Multiple Vitamins-Calcium (ONE-A-DAY WOMENS PO) Take 1 tablet by mouth daily.    [provider]  ondansetron (ZOFRAN ODT) 8 MG disintegrating tablet Take 1 tablet (8 mg total) by mouth every 8 (eight) hours as needed for nausea. 05/12/18   Varney Biles, MD  promethazine (PHENERGAN) 25 MG suppository Place 1 suppository (25 mg total) rectally every 6 (six) hours as needed for nausea. 05/12/18   Varney Biles, MD  ranitidine (ZANTAC) 150 MG capsule Take 1 capsule (150 mg total) by mouth 2 (two) times daily for 5 days. Patient taking differently: Take 150 mg by mouth daily.  03/01/18 03/23/18  Duffy Bruce, MD  tranexamic acid (LYSTEDA) 650 MG TABS tablet Take 650 mg by mouth 2 (two) times daily.     [provider]    Family History No family history on file.  Social History Social History   Tobacco Use  . Smoking status: Never Smoker  . Smokeless tobacco: Never Used  Substance Use Topics  . Alcohol use: No  . Drug use: No     Allergies   Onion   Review of Systems Review of Systems  Constitutional: Positive for fatigue. Negative for activity change and appetite change.  HENT: Positive for congestion, facial swelling, rhinorrhea and trouble swallowing.   Respiratory: Positive for cough, chest tightness and  shortness of breath.   Cardiovascular: Negative for chest pain.  Gastrointestinal: Negative for abdominal pain, nausea and vomiting.  Genitourinary: Negative for dysuria.  Skin: Positive for rash.  Neurological: Negative for weakness and headaches.    all other systems are negative except as noted in the HPI and PMH.    Physical Exam Updated Vital Signs BP 118/68 (BP Location: Right Arm)   Pulse 84   Temp 97.7 F (36.5 C) (Oral)   Resp 16   Ht (P) 5\' 1"  (1.549 m)   Wt 105.2 kg   SpO2 100%   BMI (P) 43.84 kg/m   Physical Exam Vitals signs and nursing note reviewed.  Constitutional:      General: She is not in acute distress.    Appearance: She is well-developed.  HENT:     Head: Normocephalic and atraumatic.     Mouth/Throat:     Pharynx: No oropharyngeal exudate.     Comments: Controlling secretions, no significant tongue or lip swelling.  Uvula is midline.  No sublingual swelling Eyes:     Conjunctiva/sclera: Conjunctivae normal.     Pupils: Pupils are equal, round, and reactive to light.  Neck:     Musculoskeletal: Normal range of motion and neck supple.     Comments: No meningismus. Cardiovascular:     Rate and Rhythm: Normal rate and regular rhythm.     Heart sounds: Normal heart sounds. No murmur.  Pulmonary:     Effort: Pulmonary effort is normal. No respiratory distress.     Breath sounds: Normal breath sounds. No wheezing.  Chest:     Chest wall: Tenderness present.  Abdominal:     Palpations: Abdomen is soft.     Tenderness: There is no abdominal tenderness. There is no guarding or rebound.  Musculoskeletal: Normal range of motion.        General: No tenderness.  Skin:    General: Skin is warm.     Capillary Refill: Capillary refill takes less than 2 seconds.     Findings: No rash.  Neurological:     Mental Status: She is alert and oriented to person, place, and time.     Cranial Nerves: No cranial nerve deficit.     Motor: No abnormal muscle tone.       Coordination: Coordination normal.     Comments: No ataxia on finger to nose bilaterally. No pronator drift. 5/5 strength throughout. CN 2-12 intact.Equal grip strength. Sensation intact.   Psychiatric:        Behavior: Behavior normal.      ED Treatments / Results  Labs (all labs ordered are listed, but only abnormal results are displayed) Labs Reviewed  CBC WITH DIFFERENTIAL/PLATELET - Abnormal; Notable for the following components:      Result Value  Hemoglobin 7.1 (*)    HCT 27.3 (*)    MCV 59.2 (*)    MCH 15.4 (*)    MCHC 26.0 (*)    RDW 20.2 (*)    Platelets 411 (*)    All other components within normal limits  BASIC METABOLIC PANEL - Abnormal; Notable for the following components:   Potassium 3.4 (*)    Calcium 8.7 (*)    All other components within normal limits  TROPONIN I    EKG EKG Interpretation  Date/Time:  Monday July 14 2018 03:15:03 EST Ventricular Rate:  79 PR Interval:    QRS Duration: 94 QT Interval:  374 QTC Calculation: 429 R Axis:   19 Text Interpretation:  Sinus rhythm No significant change was found Confirmed by Ezequiel Essex [phone]) on 07/14/2018 3:17:20 AM   Radiology No results found.  Procedures Procedures (including critical care time)  Medications Ordered in ED Medications  methylPREDNISolone sodium succinate (SOLU-MEDROL) 125 mg/2 mL injection 125 mg (has no administration in time range)  famotidine (PEPCID) IVPB 20 mg premix (has no administration in time range)  diphenhydrAMINE (BENADRYL) injection 25 mg (has no administration in time range)     Initial Impression / Assessment and Plan / ED Course  I have reviewed the triage vital signs and the nursing notes.  Pertinent labs & imaging results that were available during my care of the patient were reviewed by me and considered in my medical decision making (see chart for details).    Suspected anaphylactic reaction to onions.  Patient gave herself her EpiPen  around 3 AM.  Will give steroids and antihistamines.  She has no tongue or lip swelling and no difficulty breathing or swallowing.  She is not hypoxic.  EKG is unchanged sinus rhythm.  Lab work reviewed from yesterday.  Hemoglobin was 7.4.  Patient states her chest hurts only when she coughs.  Denies any shortness of breath or dizziness or lightheadedness. Atypical for ACS or PE.  Recheck 5:45 AM.  Patient states her tongue and lips and throat feel back to normal.  No chest pain or shortness of breath. Discussed that her hemoglobin today is 7.1. Troponin negative. She is currently on her menstrual cycle.  She denies any symptoms from her anemia.  She continues to take iron supplements as well as TXA per her gynecologist. She was offered a blood transfusion today but she declines and does not feel that she needs one.  This seems reasonable.  Patient feels back to baseline.  6:30 AM.  No tongue or throat swelling.  No chest pain or shortness of breath.  Patient comfortable going home.  We will treat allergic reaction with steroids and antihistamines and refill her epinephrine pen. She declines blood transfusion today. Return precautions discussed.  Final Clinical Impressions(s) / ED Diagnoses   Final diagnoses:  Anaphylaxis, initial encounter  Iron deficiency anemia due to chronic blood loss    ED Discharge Orders    None       Aniaya Bacha, Annie Main, MD 07/14/18 1652

## 2018-07-14 NOTE — ED Triage Notes (Addendum)
Pt states she accidentally ate onions about 45 min ago. States she took her epipen. No hives, wheezing, or other distress noted. Epipen given ~ 20 min PTA. Pt speaking in full sentences without distress.

## 2018-08-24 ENCOUNTER — Other Ambulatory Visit: Payer: Self-pay

## 2018-08-24 ENCOUNTER — Observation Stay (HOSPITAL_BASED_OUTPATIENT_CLINIC_OR_DEPARTMENT_OTHER)
Admission: EM | Admit: 2018-08-24 | Discharge: 2018-08-26 | Disposition: A | Discharge status: Home / Self Care | Payer: Medicaid Other | Attending: Internal Medicine | Admitting: Internal Medicine

## 2018-08-24 ENCOUNTER — Emergency Department (HOSPITAL_BASED_OUTPATIENT_CLINIC_OR_DEPARTMENT_OTHER): Payer: Medicaid Other

## 2018-08-24 ENCOUNTER — Encounter (HOSPITAL_BASED_OUTPATIENT_CLINIC_OR_DEPARTMENT_OTHER): Payer: Self-pay | Admitting: *Deleted

## 2018-08-24 DIAGNOSIS — Z8742 Personal history of other diseases of the female genital tract: Secondary | ICD-10-CM | POA: Diagnosis not present

## 2018-08-24 DIAGNOSIS — R509 Fever, unspecified: Secondary | ICD-10-CM | POA: Insufficient documentation

## 2018-08-24 DIAGNOSIS — J45909 Unspecified asthma, uncomplicated: Secondary | ICD-10-CM | POA: Insufficient documentation

## 2018-08-24 DIAGNOSIS — Z79899 Other long term (current) drug therapy: Secondary | ICD-10-CM | POA: Diagnosis not present

## 2018-08-24 DIAGNOSIS — D500 Iron deficiency anemia secondary to blood loss (chronic): Secondary | ICD-10-CM | POA: Diagnosis not present

## 2018-08-24 DIAGNOSIS — N920 Excessive and frequent menstruation with regular cycle: Secondary | ICD-10-CM | POA: Diagnosis present

## 2018-08-24 DIAGNOSIS — R05 Cough: Secondary | ICD-10-CM | POA: Diagnosis not present

## 2018-08-24 DIAGNOSIS — T454X5A Adverse effect of iron and its compounds, initial encounter: Secondary | ICD-10-CM | POA: Diagnosis not present

## 2018-08-24 DIAGNOSIS — D649 Anemia, unspecified: Secondary | ICD-10-CM | POA: Diagnosis present

## 2018-08-24 DIAGNOSIS — R0602 Shortness of breath: Secondary | ICD-10-CM | POA: Diagnosis present

## 2018-08-24 HISTORY — DX: Encounter for other specified aftercare: Z51.89

## 2018-08-24 LAB — CBC
HCT: 20.5 % — ABNORMAL LOW (ref 36.0–46.0)
Hemoglobin: 5.2 g/dL — CL (ref 12.0–15.0)
MCH: 15 pg — ABNORMAL LOW (ref 26.0–34.0)
MCHC: 25.4 g/dL — AB (ref 30.0–36.0)
MCV: 59.2 fL — AB (ref 80.0–100.0)
NRBC: 0 % (ref 0.0–0.2)
PLATELETS: 446 10*3/uL — AB (ref 150–400)
RBC: 3.46 MIL/uL — ABNORMAL LOW (ref 3.87–5.11)
RDW: 22.8 % — AB (ref 11.5–15.5)
WBC: 5.6 10*3/uL (ref 4.0–10.5)

## 2018-08-24 LAB — RETICULOCYTES
IMMATURE RETIC FRACT: 24.5 % — AB (ref 2.3–15.9)
RBC.: 3.43 MIL/uL — AB (ref 3.87–5.11)
RETIC CT PCT: 1.6 % (ref 0.4–3.1)
Retic Count, Absolute: 55.6 10*3/uL (ref 19.0–186.0)

## 2018-08-24 LAB — BASIC METABOLIC PANEL
Anion gap: 7 (ref 5–15)
BUN: 8 mg/dL (ref 6–20)
CALCIUM: 8.7 mg/dL — AB (ref 8.9–10.3)
CO2: 24 mmol/L (ref 22–32)
CREATININE: 0.82 mg/dL (ref 0.44–1.00)
Chloride: 103 mmol/L (ref 98–111)
GLUCOSE: 100 mg/dL — AB (ref 70–99)
Potassium: 3.2 mmol/L — ABNORMAL LOW (ref 3.5–5.1)
Sodium: 134 mmol/L — ABNORMAL LOW (ref 135–145)

## 2018-08-24 LAB — HCG, SERUM, QUALITATIVE: Preg, Serum: NEGATIVE

## 2018-08-24 NOTE — ED Triage Notes (Signed)
Pt reports she has been sick since Dec. Productive cough x 1 week. Dizzy. Also states her menstrual cycle has been on since September. She was started on birth control and had a blood transfusion in January. States bleeding is worse. She has had chills x 2 hours. C/o pain in back and abdomen (cramp). Also pain in chest with cough.

## 2018-08-24 NOTE — ED Notes (Signed)
Dr Venora Maples aware that hgb is 5.2.

## 2018-08-24 NOTE — ED Notes (Signed)
Report to [facility].  

## 2018-08-24 NOTE — ED Notes (Signed)
Patient transported to X-ray 

## 2018-08-24 NOTE — ED Notes (Signed)
Pt alert, smiling, talkative. States has had low hgb before.

## 2018-08-24 NOTE — ED Notes (Signed)
Pt reporting she wants to be admitted to [facility]

## 2018-08-24 NOTE — ED Notes (Signed)
[facility] notified (Tammy) - patient ready for transport 

## 2018-08-24 NOTE — ED Provider Notes (Signed)
[place] EMERGENCY DEPARTMENT Provider Note   CSN: [id number] Arrival date & time: 08/24/18  2008     History   Chief Complaint Chief Complaint  Patient presents with  . Cough  . Chills    HPI Tracey Copeland is a 28 y.o. female.  HPI Patient is a 28 year old female with a history of menorrhagia who presents the emergency department complaints of exertional shortness of breath and lightheadedness over the past 2 days.  Today she developed slight fever and productive cough and was concerned about the possibility of developing pneumonia or developing recurrent anemia.  She has a history of menorrhagia and is required blood transfusions before in the past.  She is iron deficient.  She is on iron supplementation.  She had an iron transfusion one time but reports that she had an immediate allergic/anaphylactoid type reaction and this was discontinued and she has had no IV infusion since then.  She is concerned that she could have recurrent anemia as she is felt lightheaded like this before with her anemia.  No recent sick contacts.  Denies chest pain.  No abdominal pain.  Denies nausea vomiting or diarrhea.  No melena or hematochezia described.  No active vaginal bleeding at this time.    Past Medical History:  Diagnosis Date  . Anemia   . Asthma   . Bipolar 1 disorder ([place])   . Blood transfusion without reported diagnosis   . Schizophrenia [facility])     Patient Active Problem List   Diagnosis Date Noted  . Symptomatic anemia 08/24/2018  . Menorrhagia 03/23/2018  . Anemia 03/23/2018    Past Surgical History:  Procedure Laterality Date  . DILATION AND CURETTAGE OF UTERUS       OB History   No obstetric history on file.      Home Medications    Prior to Admission medications   Medication Sig Start Date End Date Taking? Authorizing Provider  acetaminophen (TYLENOL) 500 MG tablet Take 1,000 mg by mouth every 6 (six) hours as needed for moderate pain or  headache.    [provider]  albuterol (PROVENTIL HFA;VENTOLIN HFA) 108 (90 Base) MCG/ACT inhaler Inhale 2 puffs into the lungs every 6 (six) hours as needed for wheezing or shortness of breath.    [provider]  diphenhydrAMINE (BENADRYL) 25 mg capsule Take 25 mg by mouth every 6 (six) hours as needed for itching.    [provider]  ferrous sulfate 325 (65 FE) MG tablet Take 1 tablet (325 mg total) by mouth 3 (three) times daily with meals. 03/24/18   Aline August, MD  loperamide (IMODIUM) 2 MG capsule Take 2 capsules (4 mg total) by mouth at bedtime as needed for diarrhea or loose stools. 05/12/18   Varney Biles, MD  Multiple Vitamins-Calcium (ONE-A-DAY WOMENS PO) Take 1 tablet by mouth daily.    [provider]  ondansetron (ZOFRAN ODT) 8 MG disintegrating tablet Take 1 tablet (8 mg total) by mouth every 8 (eight) hours as needed for nausea. 05/12/18   Varney Biles, MD  predniSONE (DELTASONE) 50 MG tablet 1 tablet PO daily 07/14/18   Rancour, Annie Main, MD  promethazine (PHENERGAN) 25 MG suppository Place 1 suppository (25 mg total) rectally every 6 (six) hours as needed for nausea. 05/12/18   Varney Biles, MD  ranitidine (ZANTAC) 150 MG capsule Take 1 capsule (150 mg total) by mouth 2 (two) times daily for 5 days. 07/14/18 07/19/18  Ezequiel Essex, MD  tranexamic acid (LYSTEDA)  650 MG TABS tablet Take 650 mg by mouth 2 (two) times daily.     [provider]    Family History No family history on file.  Social History Social History   Tobacco Use  . Smoking status: Never Smoker  . Smokeless tobacco: Never Used  Substance Use Topics  . Alcohol use: No  . Drug use: No     Allergies   Onion   Review of Systems Review of Systems  All other systems reviewed and are negative.    Physical Exam Updated Vital Signs BP 108/67   Pulse 99   Temp 98.4 F (36.9 C) (Oral)   Resp 19   Ht 5\' 1"  (1.549 m)   Wt 108.9 kg   SpO2 100%    BMI 45.35 kg/m   Physical Exam Vitals signs and nursing note reviewed.  Constitutional:      General: She is not in acute distress.    Appearance: She is well-developed.  HENT:     Head: Normocephalic and atraumatic.  Neck:     Musculoskeletal: Normal range of motion.  Cardiovascular:     Rate and Rhythm: Normal rate and regular rhythm.     Heart sounds: Normal heart sounds.  Pulmonary:     Effort: Pulmonary effort is normal.     Breath sounds: Normal breath sounds.  Abdominal:     General: There is no distension.     Palpations: Abdomen is soft.     Tenderness: There is no abdominal tenderness.  Musculoskeletal: Normal range of motion.  Skin:    General: Skin is warm and dry.  Neurological:     Mental Status: She is alert and oriented to person, place, and time.  Psychiatric:        Judgment: Judgment normal.      ED Treatments / Results  Labs (all labs ordered are listed, but only abnormal results are displayed) Labs Reviewed  CBC - Abnormal; Notable for the following components:      Result Value   RBC 3.46 (*)    Hemoglobin 5.2 (*)    HCT 20.5 (*)    MCV 59.2 (*)    MCH 15.0 (*)    MCHC 25.4 (*)    RDW 22.8 (*)    Platelets 446 (*)    All other components within normal limits  BASIC METABOLIC PANEL - Abnormal; Notable for the following components:   Sodium 134 (*)    Potassium 3.2 (*)    Glucose, Bld 100 (*)    Calcium 8.7 (*)    All other components within normal limits  HCG, SERUM, QUALITATIVE  VITAMIN B12  FOLATE  IRON AND TIBC  FERRITIN  RETICULOCYTES    EKG None  Radiology Dg Chest 2 View  Result Date: 08/24/2018 CLINICAL DATA:  Productive cough, dizziness and fever x1 week. EXAM: CHEST - 2 VIEW COMPARISON:  07/24/2018 FINDINGS: The heart size and mediastinal contours are within normal limits. Both lungs are clear. The visualized skeletal structures are unremarkable. IMPRESSION: No active cardiopulmonary disease. Electronically Signed   By:  Ashley Royalty M.D.   On: 08/24/2018 22:13    Procedures .Critical Care Performed by: Jola Schmidt, MD Authorized by: Jola Schmidt, MD   Critical care provider statement:    Critical care time (minutes):  31   Critical care was time spent personally by me on the following activities:  Discussions with consultants, evaluation of patient's response to treatment, examination of patient, ordering and performing  treatments and interventions, ordering and review of laboratory studies, ordering and review of radiographic studies, pulse oximetry, re-evaluation of patient's condition, obtaining history from patient or surrogate and review of old charts   (including critical care time)  Medications Ordered in ED Medications - No data to display   Initial Impression / Assessment and Plan / ED Course  I have reviewed the triage vital signs and the nursing notes.  Pertinent labs & imaging results that were available during my care of the patient were reviewed by me and considered in my medical decision making (see chart for details).     Patient with symptomatic anemia here with a hemoglobin of 5.2.  Previous hemoglobin 1 month ago was 7.1.  Patient will need blood transfusion given her symptoms.  No melena or hematochezia described.  Currently no vaginal bleeding.  Pregnancy test is negative.  Plan for transfer to [facility] long for admission for blood transfusion.  Patient and family updated.  Consultants: Dr Melvia Heaps, [facility]  Accepted to [facility]  Final Clinical Impressions(s) / ED Diagnoses   Final diagnoses:  Symptomatic anemia    ED Discharge Orders    None       Jola Schmidt, MD 08/24/18 2317

## 2018-08-24 NOTE — ED Notes (Signed)
Pt alert, smiling, talking on phone. No acute distress noted.
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
## 2018-08-24 NOTE — ED Notes (Signed)
ED Provider at bedside. 

## 2018-08-25 DIAGNOSIS — T7840XA Allergy, unspecified, initial encounter: Secondary | ICD-10-CM

## 2018-08-25 DIAGNOSIS — D649 Anemia, unspecified: Secondary | ICD-10-CM | POA: Diagnosis not present

## 2018-08-25 DIAGNOSIS — N921 Excessive and frequent menstruation with irregular cycle: Secondary | ICD-10-CM

## 2018-08-25 DIAGNOSIS — E876 Hypokalemia: Secondary | ICD-10-CM | POA: Diagnosis not present

## 2018-08-25 LAB — CBC
HCT: 30.3 % — ABNORMAL LOW (ref 36.0–46.0)
HEMATOCRIT: 27.6 % — AB (ref 36.0–46.0)
Hemoglobin: 7.7 g/dL — ABNORMAL LOW (ref 12.0–15.0)
Hemoglobin: 8.9 g/dL — ABNORMAL LOW (ref 12.0–15.0)
MCH: 19 pg — ABNORMAL LOW (ref 26.0–34.0)
MCH: 20.8 pg — ABNORMAL LOW (ref 26.0–34.0)
MCHC: 27.9 g/dL — ABNORMAL LOW (ref 30.0–36.0)
MCHC: 29.4 g/dL — ABNORMAL LOW (ref 30.0–36.0)
MCV: 68 fL — ABNORMAL LOW (ref 80.0–100.0)
MCV: 71 fL — ABNORMAL LOW (ref 80.0–100.0)
Platelets: 387 10*3/uL (ref 150–400)
Platelets: 391 10*3/uL (ref 150–400)
RBC: 4.06 MIL/uL (ref 3.87–5.11)
RBC: 4.27 MIL/uL (ref 3.87–5.11)
RDW: 29.2 % — ABNORMAL HIGH (ref 11.5–15.5)
RDW: 29.8 % — ABNORMAL HIGH (ref 11.5–15.5)
WBC: 7 10*3/uL (ref 4.0–10.5)
WBC: 5.4 10*3/uL (ref 4.0–10.5)
nRBC: 0 % (ref 0.0–0.2)
nRBC: 0 % (ref 0.0–0.2)

## 2018-08-25 LAB — BASIC METABOLIC PANEL
ANION GAP: 6 (ref 5–15)
BUN: 8 mg/dL (ref 6–20)
CO2: 25 mmol/L (ref 22–32)
Calcium: 8.1 mg/dL — ABNORMAL LOW (ref 8.9–10.3)
Chloride: 105 mmol/L (ref 98–111)
Creatinine, Ser: 0.84 mg/dL (ref 0.44–1.00)
GFR calc Af Amer: >60 mL/min (ref >60)
GFR calc non Af Amer: >60 mL/min (ref >60)
Glucose, Bld: 115 mg/dL — ABNORMAL HIGH (ref 70–99)
Potassium: 3.6 mmol/L (ref 3.5–5.1)
Sodium: 136 mmol/L (ref 135–145)

## 2018-08-25 LAB — IRON AND TIBC
Iron: 9 ug/dL — ABNORMAL LOW (ref 28–170)
Saturation Ratios: 2 % — ABNORMAL LOW (ref 10.4–31.8)
TIBC: 398 ug/dL (ref 250–450)
UIBC: 389 ug/dL

## 2018-08-25 LAB — PREPARE RBC (CROSSMATCH)

## 2018-08-25 LAB — FERRITIN: FERRITIN: 3 ng/mL — AB (ref 11–307)

## 2018-08-25 LAB — FOLATE: Folate: 12.6 ng/mL (ref >5.9)

## 2018-08-25 LAB — PROTIME-INR
INR: 0.99
Prothrombin Time: 13 seconds (ref 11.4–15.2)

## 2018-08-25 LAB — APTT: APTT: 27 s (ref 24–36)

## 2018-08-25 LAB — VITAMIN B12: Vitamin B-12: 412 pg/mL (ref 180–914)

## 2018-08-25 MED ORDER — ONDANSETRON HCL 4 MG/2ML IJ SOLN
4.0000 mg | Freq: Four times a day (QID) | INTRAMUSCULAR | Status: DC | PRN
  Administered 2018-08-25: 4 mg via INTRAVENOUS
  Filled 2018-08-25: qty 2

## 2018-08-25 MED ORDER — TRANEXAMIC ACID 650 MG PO TABS
650.0000 mg | ORAL_TABLET | Freq: Two times a day (BID) | ORAL | Status: DC
  Administered 2018-08-25 – 2018-08-26 (×3): 650 mg via ORAL
  Filled 2018-08-25 (×3): qty 1

## 2018-08-25 MED ORDER — SODIUM CHLORIDE 0.9 % IV SOLN: Freq: Once | INTRAVENOUS | Status: DC

## 2018-08-25 MED ORDER — POTASSIUM CHLORIDE CRYS ER 20 MEQ PO TBCR
40.0000 meq | EXTENDED_RELEASE_TABLET | Freq: Once | ORAL | Status: AC
  Administered 2018-08-25: 40 meq via ORAL
  Filled 2018-08-25: qty 2

## 2018-08-25 MED ORDER — ONDANSETRON HCL 4 MG PO TABS: 4.0000 mg | ORAL_TABLET | Freq: Four times a day (QID) | ORAL | Status: DC | PRN

## 2018-08-25 MED ORDER — POLYETHYLENE GLYCOL 3350 17 G PO PACK: 17.0000 g | PACK | Freq: Every day | ORAL | Status: DC | PRN

## 2018-08-25 MED ORDER — MORPHINE SULFATE (PF) 2 MG/ML IV SOLN
1.0000 mg | Freq: Once | INTRAVENOUS | Status: AC
  Administered 2018-08-25: 1 mg via INTRAVENOUS
  Filled 2018-08-25: qty 1

## 2018-08-25 MED ORDER — TRAMADOL HCL 50 MG PO TABS
50.0000 mg | ORAL_TABLET | Freq: Once | ORAL | Status: AC
  Administered 2018-08-25: 50 mg via ORAL
  Filled 2018-08-25: qty 1

## 2018-08-25 MED ORDER — DIPHENHYDRAMINE HCL 50 MG/ML IJ SOLN
50.0000 mg | Freq: Once | INTRAMUSCULAR | Status: AC
  Administered 2018-08-25: 50 mg via INTRAVENOUS

## 2018-08-25 MED ORDER — SODIUM CHLORIDE 0.9% IV SOLUTION
Freq: Once | INTRAVENOUS | Status: AC
  Administered 2018-08-25: 02:00:00 via INTRAVENOUS

## 2018-08-25 MED ORDER — FERROUS SULFATE 325 (65 FE) MG PO TABS
325.0000 mg | ORAL_TABLET | Freq: Every day | ORAL | Status: DC
  Administered 2018-08-25 – 2018-08-26 (×2): 325 mg via ORAL
  Filled 2018-08-25 (×2): qty 1

## 2018-08-25 MED ORDER — SODIUM CHLORIDE 0.9 % IV SOLN
510.0000 mg | Freq: Once | INTRAVENOUS | Status: AC
  Administered 2018-08-25: 510 mg via INTRAVENOUS
  Filled 2018-08-25: qty 17

## 2018-08-25 MED ORDER — DIPHENHYDRAMINE HCL 50 MG/ML IJ SOLN
INTRAMUSCULAR | Status: AC
  Administered 2018-08-25: 50 mg via INTRAVENOUS
  Filled 2018-08-25: qty 1

## 2018-08-25 MED ORDER — SODIUM CHLORIDE 0.9% IV SOLUTION
Freq: Once | INTRAVENOUS | Status: AC
  Administered 2018-08-25: 14:00:00 via INTRAVENOUS

## 2018-08-25 MED ORDER — ACETAMINOPHEN 325 MG PO TABS
650.0000 mg | ORAL_TABLET | Freq: Four times a day (QID) | ORAL | Status: DC | PRN
  Administered 2018-08-25: 650 mg via ORAL
  Filled 2018-08-25: qty 2

## 2018-08-25 MED ORDER — ACETAMINOPHEN 650 MG RE SUPP: 650.0000 mg | Freq: Four times a day (QID) | RECTAL | Status: DC | PRN

## 2018-08-25 NOTE — H&P (Signed)
History and Physical    Tracey Copeland ONG:[id number] DOB: 11-30-1990 DOA: 08/24/2018  PCP: Patient, No Pcp Per Patient coming from: [place]  I have personally briefly reviewed patient's old medical records in [place]  Chief Complaint: Vaginal bleeding, chills  HPI: Tracey Copeland is a 28 y.o. female with medical history significant for menometrorrhagia, iron deficiency anemia and Bipolar 1 disorder who presented to [facility] with cough, dizziness and chills. Cough is post-viral in nature with a subacute course, however, chills and mild shortness of breath are new over the last few days. She has had similar symptoms in the past when she has been anemic. She has required blood transfusions in the past for symptomatic anemia. She has difficult to manage menometrorrhagia, possibly related to uterine fibroids, resulting in chronic iron deficiency anemia. She was started on very aggressive iron repletion therapy and OCPs as an outpatient, but has continued to have heavy, irregular menses. At the time of admission, she endorses bilateral temporal headache and chills. She denies fever, chest pain, hemoptysis, abdominal pain, nausea, vomiting, diarrhea, hematemesis, hematochezia, melena, hematuria. No easy bruising or bleeding.  ED Course: In the ED, she was afebrile, mildly tachycardic but otherwise hemodynamically stable and saturating comfortably on room air. Labs notable for WBC 5.6, Hgb 5.2 (down from 7.1 on 01/06), platelets 446, K 3.2, renal function normal. CXR showed no acute abnormality. She was transferred to [facility] for transfusion.  Review of Systems: As per HPI otherwise 10 point review of systems negative.   Past Medical History:  Diagnosis Date  . Anemia   . Asthma   . Bipolar 1 disorder ([place])   . Blood transfusion without reported diagnosis   . Schizophrenia [facility])     Past Surgical History:  Procedure Laterality Date  . DILATION AND CURETTAGE OF  UTERUS       reports that she has never smoked. She has never used smokeless tobacco. She reports that she does not drink alcohol or use drugs.  Allergies  Allergen Reactions  . Onion Itching, Swelling and Rash   Family History Medical History Relation  No Known Problems Father  Heart disease Maternal Grandmother  No Known Problems Mother  Diabetes Paternal Grandmother  Heart disease Paternal Grandmother     Prior to Admission medications   Medication Sig Start Date End Date Taking? Authorizing Provider  acetaminophen (TYLENOL) 500 MG tablet Take 1,000 mg by mouth every 6 (six) hours as needed for moderate pain or headache.    [provider]  albuterol (PROVENTIL HFA;VENTOLIN HFA) 108 (90 Base) MCG/ACT inhaler Inhale 2 puffs into the lungs every 6 (six) hours as needed for wheezing or shortness of breath.    [provider]  diphenhydrAMINE (BENADRYL) 25 mg capsule Take 25 mg by mouth every 6 (six) hours as needed for itching.    [provider]  ferrous sulfate 325 (65 FE) MG tablet Take 1 tablet (325 mg total) by mouth 3 (three) times daily with meals. 03/24/18   Aline August, MD  loperamide (IMODIUM) 2 MG capsule Take 2 capsules (4 mg total) by mouth at bedtime as needed for diarrhea or loose stools. 05/12/18   Varney Biles, MD  Multiple Vitamins-Calcium (ONE-A-DAY WOMENS PO) Take 1 tablet by mouth daily.    [provider]  ondansetron (ZOFRAN ODT) 8 MG disintegrating tablet Take 1 tablet (8 mg total) by mouth every 8 (eight) hours as needed for nausea. 05/12/18   Varney Biles, MD  predniSONE (  DELTASONE) 50 MG tablet 1 tablet PO daily 07/14/18   Rancour, Annie Main, MD  promethazine (PHENERGAN) 25 MG suppository Place 1 suppository (25 mg total) rectally every 6 (six) hours as needed for nausea. 05/12/18   Varney Biles, MD  ranitidine (ZANTAC) 150 MG capsule Take 1 capsule (150 mg total) by mouth 2 (two) times daily for 5 days. 07/14/18 07/19/18   Rancour, Annie Main, MD  tranexamic acid (LYSTEDA) 650 MG TABS tablet Take 650 mg by mouth 2 (two) times daily.     [provider]    Physical Exam: Vitals:   08/24/18 2210 08/24/18 2230 08/24/18 2300 08/25/18 0002  BP: (!) 101/53 103/65 108/67   Pulse: 99 (!) 104 99   Resp: 18 14 19    Temp:      TempSrc:      SpO2: 100% 100% 100%   Weight:    99.9 kg  Height:    5\' 1"  (1.549 m)    Constitutional: NAD, calm, comfortable Eyes: PERRL, (+) conjunctival rim pallor, no icterus ENMT: Mucous membranes are moist. Posterior pharynx clear of any exudate or lesions. Neck: normal, supple, no masses, Respiratory: clear to auscultation bilaterally, no wheezing, no crackles. Normal respiratory effort. Cardiovascular: Regular rate and rhythm, no murmurs / rubs / gallops. No extremity edema. 2+ pedal pulses. Abdomen: no tenderness, no masses palpated.  Bowel sounds positive.  Musculoskeletal: no clubbing / cyanosis. No joint deformity upper and lower extremities. Good ROM, no contractures. Normal muscle tone.  Skin: no rashes, lesions, ulcers. No induration Neurologic: CN 2-12 grossly intact. Sensation intact, DTR normal. Strength 5/5 in all 4.  Psychiatric: Normal judgment and insight. Alert and oriented x 3. Normal mood.    Labs on Admission: I have personally reviewed following labs and imaging studies  CBC: Recent Labs  Lab 08/24/18 2052  WBC 5.6  HGB 5.2*  HCT 20.5*  MCV 59.2*  PLT 811*   Basic Metabolic Panel: Recent Labs  Lab 08/24/18 2052  NA 134*  K 3.2*  CL 103  CO2 24  GLUCOSE 100*  BUN 8  CREATININE 0.82  CALCIUM 8.7*   GFR: Estimated Creatinine Clearance: 109.6 mL/min (by C-G formula based on SCr of 0.82 mg/dL). Liver Function Tests: No results for input(s): AST, ALT, ALKPHOS, BILITOT, PROT, ALBUMIN in the last 168 hours. No results for input(s): LIPASE, AMYLASE in the last 168 hours. No results for input(s): AMMONIA in the last 168 hours. Coagulation  Profile: No results for input(s): INR, PROTIME in the last 168 hours. Cardiac Enzymes: No results for input(s): CKTOTAL, CKMB, CKMBINDEX, TROPONINI in the last 168 hours. BNP (last 3 results) No results for input(s): PROBNP in the last 8760 hours. HbA1C: No results for input(s): HGBA1C in the last 72 hours. CBG: No results for input(s): GLUCAP in the last 168 hours. Lipid Profile: No results for input(s): CHOL, HDL, LDLCALC, TRIG, CHOLHDL, LDLDIRECT in the last 72 hours. Thyroid Function Tests: No results for input(s): TSH, T4TOTAL, FREET4, T3FREE, THYROIDAB in the last 72 hours. Anemia Panel: Recent Labs    08/24/18 2200  RETICCTPCT 1.6   Urine analysis:    Component Value Date/Time   COLORURINE YELLOW 11/26/2016 2254   APPEARANCEUR CLEAR 11/26/2016 2254   LABSPEC 1.014 11/26/2016 2254   PHURINE 5.5 11/26/2016 2254   GLUCOSEU NEGATIVE 11/26/2016 2254   HGBUR LARGE (A) 11/26/2016 2254   BILIRUBINUR NEGATIVE 11/26/2016 [place] 11/26/2016 2254   PROTEINUR NEGATIVE 11/26/2016 2254   UROBILINOGEN 1.0 09/17/2014  1950   NITRITE NEGATIVE 11/26/2016 2254   LEUKOCYTESUR NEGATIVE 11/26/2016 2254    Radiological Exams on Admission: Dg Chest 2 View  Result Date: 08/24/2018 CLINICAL DATA:  Productive cough, dizziness and fever x1 week. EXAM: CHEST - 2 VIEW COMPARISON:  07/24/2018 FINDINGS: The heart size and mediastinal contours are within normal limits. Both lungs are clear. The visualized skeletal structures are unremarkable. IMPRESSION: No active cardiopulmonary disease. Electronically Signed   By: Ashley Royalty M.D.   On: 08/24/2018 22:13    Assessment/Plan Active Problems:   Symptomatic anemia  Ms. Raj has history of iron deficiency anemia 2/2 menometrorrhagia. She is followed by GYN at [facility]. Most recent Hgb 7.1 approximately 5 weeks ago. No additional source of blood loss.  Symptomatic iron deficiency anemia 2/2 menometrorrhagia - Transfuse 2 U  PRBC - Goal Hgb >7 - Maintain active T&S - Follow up AM CBC - Follow up iron profile, B12, folate - Check coags - Continue daily iron supplementation, decrease to once daily given lack of proven benefit to multiple doses per day - Outpatient follow up with GYN for menorrhagia  Hyperkalemia - Replete potassium orally - F/u AM BMP  DVT prophylaxis: SCD Code Status: Full Disposition Plan: Home tomorrow Consults called: None Admission status: Obs, Med-Surg   Bennie Pierini MD [facility]  If 7PM-7AM, please contact night-coverage [email or web address] Password [facility]  08/25/2018, 12:14 AM

## 2018-08-25 NOTE — Progress Notes (Signed)
PROGRESS NOTE    Tracey Copeland   VPX:[id number]  DOB: [birth year]  DOA: 08/24/2018 PCP: Patient, No Pcp Per   Brief Narrative:  Tracey Copeland is a 28 y.o. female with medical history significant for menometrorrhagia, iron deficiency anemia and Bipolar 1 disorder who presented to [facility] with cough, dizziness and chills. In ED found to have a Hb of 5.6 and admitted for blood transfusion.  Subjective: She has no complaints today.     Assessment & Plan:   Principal Problem:   Symptomatic anemia- allergic reaction to Iron - her Hb has improved to 7.7 after 2 u PRBC.  - Although she taked Iron tabs QID, she has a ferritin of 3 and thus I decided to give her an Iron transfusion with Feraheme. She noted dyspnea and pain in back and legs after the infusion started and thus it was stopped and she was given Benadryl. BP did not drop- she did not develop a rash and she was not hypoxic with the Iron. - I will give her another unit of blood as it does not seem the oral Iron tabs will be sufficient to help her normalized her Hb level  Active Problems:   Menorrhagia - currently on Tranexamic acid by her Gyn  Time spent in minutes: 35 min DVT prophylaxis: SCDs Code Status: full code Family Communication:  Disposition Plan: home tomorrow Consultants:   none Procedures:   none Antimicrobials:  Anti-infectives (From admission, onward)   None       Objective: Vitals:   08/25/18 0748 08/25/18 0941 08/25/18 1307 08/25/18 1316  BP: (!) 129/56 92/60 107/61 110/68  Pulse: 89 71 81 75  Resp: 20 18 18  (!) 24  Temp:  98.6 F (37 C) 98.1 F (36.7 C)   TempSrc:  Oral Oral   SpO2: 100% 100% 100% 100%  Weight:      Height:        Intake/Output Summary (Last 24 hours) at 08/25/2018 1558 Last data filed at 08/25/2018 0900 Gross per 24 hour  Intake 890.67 ml  Output -  Net 890.67 ml   Filed Weights   08/24/18 2014 08/25/18 0002  Weight: 108.9 kg 99.9 kg     Examination: General exam: Appears comfortable  HEENT: PERRLA, oral mucosa moist, no sclera icterus or thrush Respiratory system: Clear to auscultation. Respiratory effort normal. Cardiovascular system: S1 & S2 heard, RRR.   Gastrointestinal system: Abdomen soft, non-tender, nondistended. Normal bowel sounds. Central nervous system: Alert and oriented. No focal neurological deficits. Extremities: No cyanosis, clubbing or edema Skin: No rashes or ulcers Psychiatry:  Mood & affect appropriate.     Data Reviewed: I have personally reviewed following labs and imaging studies  CBC: Recent Labs  Lab 08/24/18 2052 08/25/18 1118  WBC 5.6 7.0  HGB 5.2* 7.7*  HCT 20.5* 27.6*  MCV 59.2* 68.0*  PLT 446* 462   Basic Metabolic Panel: Recent Labs  Lab 08/24/18 2052 08/25/18 1118  NA 134* 136  K 3.2* 3.6  CL 103 105  CO2 24 25  GLUCOSE 100* 115*  BUN 8 8  CREATININE 0.82 0.84  CALCIUM 8.7* 8.1*   GFR: Estimated Creatinine Clearance: 107 mL/min (by C-G formula based on SCr of 0.84 mg/dL). Liver Function Tests: No results for input(s): AST, ALT, ALKPHOS, BILITOT, PROT, ALBUMIN in the last 168 hours. No results for input(s): LIPASE, AMYLASE in the last 168 hours. No results for input(s): AMMONIA in the last 168 hours. Coagulation Profile: Recent Labs  Lab  08/25/18 0118  INR 0.99   Cardiac Enzymes: No results for input(s): CKTOTAL, CKMB, CKMBINDEX, TROPONINI in the last 168 hours. BNP (last 3 results) No results for input(s): PROBNP in the last 8760 hours. HbA1C: No results for input(s): HGBA1C in the last 72 hours. CBG: No results for input(s): GLUCAP in the last 168 hours. Lipid Profile: No results for input(s): CHOL, HDL, LDLCALC, TRIG, CHOLHDL, LDLDIRECT in the last 72 hours. Thyroid Function Tests: No results for input(s): TSH, T4TOTAL, FREET4, T3FREE, THYROIDAB in the last 72 hours. Anemia Panel: Recent Labs    08/24/18 2200  VITAMINB12 412  FOLATE 12.6   FERRITIN 3*  TIBC 398  IRON 9*  RETICCTPCT 1.6   Urine analysis:    Component Value Date/Time   COLORURINE YELLOW 11/26/2016 2254   APPEARANCEUR CLEAR 11/26/2016 2254   LABSPEC 1.014 11/26/2016 2254   PHURINE 5.5 11/26/2016 2254   GLUCOSEU NEGATIVE 11/26/2016 2254   HGBUR LARGE (A) 11/26/2016 2254   BILIRUBINUR NEGATIVE 11/26/2016 2254   KETONESUR NEGATIVE 11/26/2016 2254   PROTEINUR NEGATIVE 11/26/2016 2254   UROBILINOGEN 1.0 09/17/2014 1950   NITRITE NEGATIVE 11/26/2016 2254   LEUKOCYTESUR NEGATIVE 11/26/2016 2254   Sepsis Labs: @LABRCNTIP (procalcitonin:4,lacticidven:4) )No results found for this or any previous visit (from the past 240 hour(s)).       Radiology Studies: Dg Chest 2 View  Result Date: 08/24/2018 CLINICAL DATA:  Productive cough, dizziness and fever x1 week. EXAM: CHEST - 2 VIEW COMPARISON:  07/24/2018 FINDINGS: The heart size and mediastinal contours are within normal limits. Both lungs are clear. The visualized skeletal structures are unremarkable. IMPRESSION: No active cardiopulmonary disease. Electronically Signed   By: Ashley Royalty M.D.   On: 08/24/2018 22:13      Scheduled Meds: . sodium chloride   Intravenous Once  . ferrous sulfate  325 mg Oral Q breakfast  . tranexamic acid  650 mg Oral BID   Continuous Infusions: . sodium chloride Stopped (08/25/18 1054)     LOS: 0 days      Debbe Odea, MD [facility] Pager: [email or web address] Password [facility] 08/25/2018, 3:58 PM

## 2018-08-25 NOTE — Progress Notes (Signed)
1307 Feraheme was   started. Within 3 minutes I was called to pt room with "something is wrong". She called me on my cell phonei went immediately to her  room. Stopped the ferriheme infusion and stated NACL, She was short of breath. C/o of sever lower mid back pain and cramping in her calves. Dr Wynelle Cleveland had been notified. She ordered benadryl 50 mg ivp.1343 Pt back pain was decreasing and she became sleepy from the benadryl. Her VS were stable through out this episode. I will send a textt to Dr Wynelle Cleveland to give her an update

## 2018-08-26 DIAGNOSIS — D649 Anemia, unspecified: Secondary | ICD-10-CM | POA: Diagnosis not present

## 2018-08-26 DIAGNOSIS — N921 Excessive and frequent menstruation with irregular cycle: Secondary | ICD-10-CM | POA: Diagnosis not present

## 2018-08-26 LAB — BPAM RBC
Blood Product Expiration Date: 202003112359
Blood Product Expiration Date: 202003112359
Blood Product Expiration Date: 202003112359
ISSUE DATE / TIME: 202002170207
ISSUE DATE / TIME: 202002170520
ISSUE DATE / TIME: 202002171710
Unit Type and Rh: 6200
Unit Type and Rh: 6200
Unit Type and Rh: 6200

## 2018-08-26 LAB — TYPE AND SCREEN
ABO/RH(D): A POS
Antibody Screen: NEGATIVE
Unit division: 0
Unit division: 0
Unit division: 0

## 2018-08-26 MED ORDER — TRAMADOL HCL 50 MG PO TABS
50.0000 mg | ORAL_TABLET | Freq: Once | ORAL | Status: AC | PRN
  Administered 2018-08-26: 50 mg via ORAL
  Filled 2018-08-26: qty 1

## 2018-08-26 MED ORDER — BUTALBITAL-APAP-CAFFEINE 50-325-40 MG PO TABS
1.0000 | ORAL_TABLET | Freq: Once | ORAL | Status: AC
  Administered 2018-08-26: 1 via ORAL
  Filled 2018-08-26: qty 1

## 2018-08-26 MED ORDER — TRAMADOL HCL 50 MG PO TABS: 50.0000 mg | ORAL_TABLET | Freq: Once | ORAL | Status: DC | PRN

## 2018-08-26 NOTE — Progress Notes (Signed)
Pt d/c home with significant other. She was transported in a wheelchair to the car. She verbalized understanding of d/c instructions and encouraged to find a PCP. She agreed

## 2018-08-26 NOTE — Discharge Instructions (Signed)
Your Hemoglobin needs to be checked on a monthly basis. Continue to take your Iron as your are.  You were cared for by a hospitalist during your hospital stay. If you have any questions about your discharge medications or the care you received while you were in the hospital after you are discharged, you can call the unit and asked to speak with the hospitalist on call if the hospitalist that took care of you is not available. Once you are discharged, your primary care physician will handle any further medical issues.   Please note that NO REFILLS for any discharge medications will be authorized once you are discharged, as it is imperative that you return to your primary care physician (or establish a relationship with a primary care physician if you do not have one) for your aftercare needs so that they can reassess your need for medications and monitor your lab values.  Please take all your medications with you for your next visit with your Primary MD. Please ask your Primary MD to get all Hospital records sent to his/her office. Please request your Primary MD to go over all hospital test results at the follow up.   If you experience worsening of your admission symptoms, develop shortness of breath, chest pain, suicidal or homicidal thoughts or a life threatening emergency, you must seek medical attention immediately by calling 911 or calling your MD.   Dennis Bast must read the complete instructions/literature along with all the possible adverse reactions/side effects for all the medicines you take including new medications that have been prescribed to you. Take new medicines after you have completely understood and accpet all the possible adverse reactions/side effects.    Do not drive when taking pain medications or sedatives.     Do not take more than prescribed Pain, Sleep and Anxiety Medications   If you have smoked or chewed Tobacco in the last 2 yrs please stop. Stop any regular alcohol  and or  recreational drug use.   Wear Seat belts while driving.

## 2018-08-27 NOTE — Discharge Summary (Signed)
Physician Discharge Summary  Tracey Copeland VOH:[id number] DOB: [birth year] DOA: 08/24/2018  PCP: Patient, No Pcp Per  Admit date: 08/24/2018 Discharge date: 08/27/2018  Admitted From: home Disposition:  home   Recommendations for Outpatient Follow-up:  1. F/u Hb monthly 2. Consider premedication for iron transfusions if her Feritin level remains low despite oral iron intake    Discharge Condition:  stable   CODE STATUS:  Full code   Diet recommendation:  Regular diet Consultations:  none    Discharge Diagnoses:  Principal Problem:   Symptomatic anemia Active Problems:   Menorrhagia       Brief Summary: Tracey Copeland is a 28 y.o.femalewith medical history significant formenometrorrhagia, iron deficiency anemia and Bipolar 1 disorder who presented to [facility] with cough, dizziness and chills. In ED found to have a Hb of 5.6 and admitted for blood transfusion.  Hospital Course:  Symptomatic anemia- allergic reaction to Iron - her Hb has improved to 7.7 after 2 u PRBC.  - Although she taked Iron tabs QID, she has a ferritin of 3 and thus I decided to give her an Iron transfusion with Feraheme. She noted dyspnea and pain in back and legs after the infusion started and thus it was stopped and she was given Benadryl. BP did not drop- she did not develop a rash and she was not hypoxic with the Iron. - I gave her another unit of blood as it does not seem the oral Iron tabs will be sufficient to help her normalized her Hb level  Active Problems:   Menorrhagia - currently on Tranexamic acid by her Gyn  Discharge Exam: Vitals:   08/25/18 1944 08/26/18 0546  BP: 113/71 (!) 95/59  Pulse: 66 67  Resp: 18 15  Temp: 98.1 F (36.7 C) 98.5 F (36.9 C)  SpO2: 100% 100%   Vitals:   08/25/18 1715 08/25/18 1741 08/25/18 1944 08/26/18 0546  BP: 109/78 108/72 113/71 (!) 95/59  Pulse: 75 72 66 67  Resp:  18 18 15   Temp: 97.9 F (36.6 C) 97.9 F (36.6 C) 98.1 F (36.7  C) 98.5 F (36.9 C)  TempSrc:  Oral Oral Oral  SpO2: 100%  100% 100%  Weight:      Height:        General: Pt is alert, awake, not in acute distress Cardiovascular: RRR, S1/S2 +, no rubs, no gallops Respiratory: CTA bilaterally, no wheezing, no rhonchi Abdominal: Soft, NT, ND, bowel sounds + Extremities: no edema, no cyanosis   Discharge Instructions  Discharge Instructions    Diet - low sodium heart healthy   Complete by:  As directed    Increase activity slowly   Complete by:  As directed      Allergies as of 08/26/2018      Reactions   Feraheme [ferumoxytol] Shortness Of Breath   Onion Itching, Swelling, Rash      Medication List    TAKE these medications   albuterol 108 (90 Base) MCG/ACT inhaler Commonly known as:  PROVENTIL HFA;VENTOLIN HFA Inhale 2 puffs into the lungs every 6 (six) hours as needed for wheezing or shortness of breath.   ferrous sulfate 325 (65 FE) MG tablet Take 1 tablet (325 mg total) by mouth 3 (three) times daily with meals. What changed:  when to take this   ibuprofen 200 MG tablet Commonly known as:  ADVIL,MOTRIN Take 400 mg by mouth every 6 (six) hours as needed for headache, mild pain or moderate pain.   ONE-A-DAY WOMENS  PO Take 1 tablet by mouth daily.   tranexamic acid 650 MG Tabs tablet Commonly known as:  LYSTEDA Take 650 mg by mouth 2 (two) times daily.      Follow-up Information    Magrinat, Virgie Dad, MD Follow up.   Specialty:  Oncology Why:  please obtain an appointment with any hematologist for your Iron deficiency anemia for next month.  Contact information: [address] [place] [address] [phone]          Allergies  Allergen Reactions  . Feraheme [Ferumoxytol] Shortness Of Breath  . Onion Itching, Swelling and Rash     Procedures/Studies:    Dg Chest 2 View  Result Date: 08/24/2018 CLINICAL DATA:  Productive cough, dizziness and fever x1 week. EXAM: CHEST - 2 VIEW COMPARISON:   07/24/2018 FINDINGS: The heart size and mediastinal contours are within normal limits. Both lungs are clear. The visualized skeletal structures are unremarkable. IMPRESSION: No active cardiopulmonary disease. Electronically Signed   By: Ashley Royalty M.D.   On: 08/24/2018 22:13      The results of significant diagnostics from this hospitalization (including imaging, microbiology, ancillary and laboratory) are listed below for reference.     Microbiology: No results found for this or any previous visit (from the past 240 hour(s)).   Labs: BNP (last 3 results) No results for input(s): BNP in the last 8760 hours. Basic Metabolic Panel: Recent Labs  Lab 08/24/18 2052 08/25/18 1118  NA 134* 136  K 3.2* 3.6  CL 103 105  CO2 24 25  GLUCOSE 100* 115*  BUN 8 8  CREATININE 0.82 0.84  CALCIUM 8.7* 8.1*   Liver Function Tests: No results for input(s): AST, ALT, ALKPHOS, BILITOT, PROT, ALBUMIN in the last 168 hours. No results for input(s): LIPASE, AMYLASE in the last 168 hours. No results for input(s): AMMONIA in the last 168 hours. CBC: Recent Labs  Lab 08/24/18 2052 08/25/18 1118 08/25/18 2151  WBC 5.6 7.0 5.4  HGB 5.2* 7.7* 8.9*  HCT 20.5* 27.6* 30.3*  MCV 59.2* 68.0* 71.0*  PLT 446* 387 391   Cardiac Enzymes: No results for input(s): CKTOTAL, CKMB, CKMBINDEX, TROPONINI in the last 168 hours. BNP: Invalid input(s): POCBNP CBG: No results for input(s): GLUCAP in the last 168 hours. D-Dimer No results for input(s): DDIMER in the last 72 hours. Hgb A1c No results for input(s): HGBA1C in the last 72 hours. Lipid Profile No results for input(s): CHOL, HDL, LDLCALC, TRIG, CHOLHDL, LDLDIRECT in the last 72 hours. Thyroid function studies No results for input(s): TSH, T4TOTAL, T3FREE, THYROIDAB in the last 72 hours.  Invalid input(s): FREET3 Anemia work up Recent Labs    08/24/18 2200  VITAMINB12 412  FOLATE 12.6  FERRITIN 3*  TIBC 398  IRON 9*  RETICCTPCT 1.6    Urinalysis    Component Value Date/Time   COLORURINE YELLOW 11/26/2016 2254   APPEARANCEUR CLEAR 11/26/2016 2254   LABSPEC 1.014 11/26/2016 2254   PHURINE 5.5 11/26/2016 2254   GLUCOSEU NEGATIVE 11/26/2016 2254   HGBUR LARGE (A) 11/26/2016 2254   BILIRUBINUR NEGATIVE 11/26/2016 2254   KETONESUR NEGATIVE 11/26/2016 2254   PROTEINUR NEGATIVE 11/26/2016 2254   UROBILINOGEN 1.0 09/17/2014 1950   NITRITE NEGATIVE 11/26/2016 2254   LEUKOCYTESUR NEGATIVE 11/26/2016 2254   Sepsis Labs Invalid input(s): PROCALCITONIN,  WBC,  LACTICIDVEN Microbiology No results found for this or any previous visit (from the past 240 hour(s)).   Time coordinating discharge in minutes: 70  SIGNED:  Debbe Odea, MD  [facility] 08/27/2018, 2:54 PM Pager   If 7PM-7AM, please contact night-coverage [email or web address] Password [email or web address]

## 2018-09-03 ENCOUNTER — Encounter (HOSPITAL_BASED_OUTPATIENT_CLINIC_OR_DEPARTMENT_OTHER): Payer: Self-pay

## 2018-09-03 ENCOUNTER — Emergency Department (HOSPITAL_BASED_OUTPATIENT_CLINIC_OR_DEPARTMENT_OTHER): Payer: No Typology Code available for payment source

## 2018-09-03 ENCOUNTER — Other Ambulatory Visit: Payer: Self-pay

## 2018-09-03 ENCOUNTER — Emergency Department (HOSPITAL_BASED_OUTPATIENT_CLINIC_OR_DEPARTMENT_OTHER)
Admission: EM | Admit: 2018-09-03 | Discharge: 2018-09-03 | Disposition: A | Discharge status: Home / Self Care | Payer: No Typology Code available for payment source | Attending: Emergency Medicine | Admitting: Emergency Medicine

## 2018-09-03 DIAGNOSIS — Z79899 Other long term (current) drug therapy: Secondary | ICD-10-CM | POA: Insufficient documentation

## 2018-09-03 DIAGNOSIS — J45909 Unspecified asthma, uncomplicated: Secondary | ICD-10-CM | POA: Diagnosis not present

## 2018-09-03 DIAGNOSIS — R51 Headache: Secondary | ICD-10-CM | POA: Diagnosis present

## 2018-09-03 DIAGNOSIS — Y9389 Activity, other specified: Secondary | ICD-10-CM | POA: Insufficient documentation

## 2018-09-03 DIAGNOSIS — Y92410 Unspecified street and highway as the place of occurrence of the external cause: Secondary | ICD-10-CM | POA: Insufficient documentation

## 2018-09-03 DIAGNOSIS — Y998 Other external cause status: Secondary | ICD-10-CM | POA: Insufficient documentation

## 2018-09-03 DIAGNOSIS — M542 Cervicalgia: Secondary | ICD-10-CM | POA: Diagnosis not present

## 2018-09-03 MED ORDER — METHOCARBAMOL 500 MG PO TABS: 500.0000 mg | ORAL_TABLET | Freq: Two times a day (BID) | ORAL | 0 refills | Status: DC

## 2018-09-03 MED ORDER — NAPROXEN 500 MG PO TABS: 500.0000 mg | ORAL_TABLET | Freq: Two times a day (BID) | ORAL | 0 refills | Status: DC
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
## 2018-09-11 ENCOUNTER — Other Ambulatory Visit: Payer: Self-pay | Admitting: Family

## 2018-09-11 DIAGNOSIS — D500 Iron deficiency anemia secondary to blood loss (chronic): Secondary | ICD-10-CM

## 2018-09-11 DIAGNOSIS — D649 Anemia, unspecified: Secondary | ICD-10-CM

## 2018-09-11 DIAGNOSIS — D509 Iron deficiency anemia, unspecified: Secondary | ICD-10-CM | POA: Insufficient documentation

## 2018-09-12 ENCOUNTER — Inpatient Hospital Stay (HOSPITAL_BASED_OUTPATIENT_CLINIC_OR_DEPARTMENT_OTHER): Payer: Medicaid Other | Admitting: Family

## 2018-09-12 ENCOUNTER — Inpatient Hospital Stay: Payer: Medicaid Other | Attending: Hematology & Oncology

## 2018-09-12 DIAGNOSIS — N921 Excessive and frequent menstruation with irregular cycle: Secondary | ICD-10-CM | POA: Insufficient documentation

## 2018-09-12 DIAGNOSIS — D500 Iron deficiency anemia secondary to blood loss (chronic): Secondary | ICD-10-CM

## 2018-09-12 DIAGNOSIS — F209 Schizophrenia, unspecified: Secondary | ICD-10-CM | POA: Insufficient documentation

## 2018-09-12 DIAGNOSIS — D649 Anemia, unspecified: Secondary | ICD-10-CM

## 2018-09-12 DIAGNOSIS — F319 Bipolar disorder, unspecified: Secondary | ICD-10-CM | POA: Insufficient documentation

## 2018-09-12 LAB — CBC WITH DIFFERENTIAL (CANCER CENTER ONLY)
Abs Immature Granulocytes: 0.03 10*3/uL (ref 0.00–0.07)
Basophils Absolute: 0 10*3/uL (ref 0.0–0.1)
Basophils Relative: 1 %
EOS ABS: 0.1 10*3/uL (ref 0.0–0.5)
Eosinophils Relative: 1 %
HCT: 33.7 % — ABNORMAL LOW (ref 36.0–46.0)
Hemoglobin: 10 g/dL — ABNORMAL LOW (ref 12.0–15.0)
Immature Granulocytes: 0 %
Lymphocytes Relative: 27 %
Lymphs Abs: 2.1 10*3/uL (ref 0.7–4.0)
MCH: 21.2 pg — ABNORMAL LOW (ref 26.0–34.0)
MCHC: 29.7 g/dL — ABNORMAL LOW (ref 30.0–36.0)
MCV: 71.4 fL — ABNORMAL LOW (ref 80.0–100.0)
Monocytes Absolute: 0.4 10*3/uL (ref 0.1–1.0)
Monocytes Relative: 5 %
NEUTROS PCT: 66 %
Neutro Abs: 5.2 10*3/uL (ref 1.7–7.7)
Platelet Count: 533 10*3/uL — ABNORMAL HIGH (ref 150–400)
RBC: 4.72 MIL/uL (ref 3.87–5.11)
WBC Count: 7.9 10*3/uL (ref 4.0–10.5)
nRBC: 0 % (ref 0.0–0.2)

## 2018-09-12 LAB — CMP (CANCER CENTER ONLY)
ALT: 13 U/L (ref 0–44)
AST: 11 U/L — ABNORMAL LOW (ref 15–41)
Albumin: 4.1 g/dL (ref 3.5–5.0)
Alkaline Phosphatase: 97 U/L (ref 38–126)
Anion gap: 6 (ref 5–15)
BUN: 9 mg/dL (ref 6–20)
CO2: 28 mmol/L (ref 22–32)
Calcium: 9.2 mg/dL (ref 8.9–10.3)
Chloride: 105 mmol/L (ref 98–111)
Creatinine: 0.77 mg/dL (ref 0.44–1.00)
GFR, Est AFR Am: >60 mL/min
GFR, Estimated: >60 mL/min
Glucose, Bld: 99 mg/dL (ref 70–99)
Potassium: 4 mmol/L (ref 3.5–5.1)
Sodium: 139 mmol/L (ref 135–145)
Total Bilirubin: 0.2 mg/dL — ABNORMAL LOW (ref 0.3–1.2)
Total Protein: 7.5 g/dL (ref 6.5–8.1)

## 2018-09-12 LAB — VITAMIN B12: Vitamin B-12: 342 pg/mL (ref 180–914)

## 2018-09-12 LAB — RETICULOCYTES
Immature Retic Fract: 18.6 % — ABNORMAL HIGH (ref 2.3–15.9)
RBC.: 4.72 MIL/uL (ref 3.87–5.11)
Retic Count, Absolute: 39.2 10*3/uL (ref 19.0–186.0)
Retic Ct Pct: 0.8 % (ref 0.4–3.1)

## 2018-09-12 LAB — SAVE SMEAR(SSMR), FOR PROVIDER SLIDE REVIEW

## 2018-09-12 MED ORDER — FOLIC ACID 1 MG PO TABS: 1.0000 mg | ORAL_TABLET | Freq: Every day | ORAL | 11 refills | Status: DC

## 2018-09-12 NOTE — Progress Notes (Signed)
Hematology/Oncology Consultation   Name: Tracey Copeland      MRN: [id number]    Location: Room/bed info not found  Date: 09/12/2018 Time:1:35 PM   REFERRING PHYSICIAN: Post hospital follow-up  REASON FOR CONSULT:  Symptomatic anemia   DIAGNOSIS: Iron deficiency anemia secondary to heavy cycle  HISTORY OF PRESENT ILLNESS: Tracey Copeland is a very pleasant 28 yo African American female with history of iron deficiency anemia secondary to heavy irregular cycle. She has required multiple transfusions in the past with her most recent being during [facility] admission in February of this year She states that she received 3 units of PRBCs and IV iron (Feraheme) for Hgb of 5.2. She is now up to a Hgb of 10.0 and MCV 71.  She states that she did have an adverse reaction (SOB) to the [facility] as well as to the IV iron she received last fall with [facility].  We will change her to [facility] for any future infusions and also premedicate.  She has started Lysteda BID and her cycle which had been on since November until earlier this week has stopped.  She states that she has had a D&C in the passed that did not work.  She has failed oral iron.  She has never been pregnant and has no history of miscarriage.  She and her wife are hoping to have a baby and she is followed closely by gynecology.  Her mother and maternal grandmother both also have history of anemia.  She had her wisdom teeth extracted last week on Friday and has had no issue with bleeding.  No bruising or petechiae.  No personal or familial history of sickle cell disease or trait that she is aware of.  No fever, chills, n/v, cough, rash, dizziness, chest pain, palpitations, abdominal pain or changes in bowel or bladder habits at this time.   She has occasional episodes of SOB associated with asthma.  No swelling, tenderness, numbness or tingling in her extremities.  No lymphadenopathy noted on exam.   She does not smoke and only has  an alcoholic beverage occasionally socially.  She stays quite busy with her wife and step son. She does work in Geophysicist/field seismologist for [employer]: All other 10 point review of systems is negative.   PAST MEDICAL HISTORY:   Past Medical History:  Diagnosis Date  . Anemia   . Asthma   . Bipolar 1 disorder ([place])   . Blood transfusion without reported diagnosis   . Schizophrenia ([place])     ALLERGIES: Allergies  Allergen Reactions  . Feraheme [Ferumoxytol] Shortness Of Breath  . Onion Itching, Swelling and Rash      MEDICATIONS:  Current Outpatient Medications on File Prior to Visit  Medication Sig Dispense Refill  . albuterol (PROVENTIL HFA;VENTOLIN HFA) 108 (90 Base) MCG/ACT inhaler Inhale 2 puffs into the lungs every 6 (six) hours as needed for wheezing or shortness of breath.    . ferrous sulfate 325 (65 FE) MG tablet Take 1 tablet (325 mg total) by mouth 3 (three) times daily with meals. (Patient taking differently: Take 325 mg by mouth 4 (four) times daily -  with meals and at bedtime. )    . ibuprofen (ADVIL,MOTRIN) 200 MG tablet Take 400 mg by mouth every 6 (six) hours as needed for headache, mild pain or moderate pain.    . methocarbamol (ROBAXIN) 500 MG tablet Take 1 tablet (500 mg total) by mouth 2 (two) times daily. 20 tablet  0  . Multiple Vitamins-Calcium (ONE-A-DAY WOMENS PO) Take 1 tablet by mouth daily.    . naproxen (NAPROSYN) 500 MG tablet Take 1 tablet (500 mg total) by mouth 2 (two) times daily. 30 tablet 0  . tranexamic acid (LYSTEDA) 650 MG TABS tablet Take 650 mg by mouth 2 (two) times daily.      No current facility-administered medications on file prior to visit.      PAST SURGICAL HISTORY Past Surgical History:  Procedure Laterality Date  . DILATION AND CURETTAGE OF UTERUS      FAMILY HISTORY: No family history on file.  SOCIAL HISTORY:  reports that she has never smoked. She has never used smokeless tobacco. She reports that she does  not drink alcohol or use drugs.  PERFORMANCE STATUS: The patient's performance status is 0 - Asymptomatic  PHYSICAL EXAM: Most Recent Vital Signs: There were no vitals taken for this visit. There were no vitals taken for this visit.  General Appearance:    Alert, cooperative, no distress, appears stated age  Head:    Normocephalic, without obvious abnormality, atraumatic  Eyes:    PERRL, conjunctiva/corneas clear, EOM's intact, fundi    benign, both eyes        Throat:   Lips, mucosa, and tongue normal; teeth and gums normal  Neck:   Supple, symmetrical, trachea midline, no adenopathy;    thyroid:  no enlargement/tenderness/nodules; no carotid   bruit or JVD  Back:     Symmetric, no curvature, ROM normal, no CVA tenderness  Lungs:     Clear to auscultation bilaterally, respirations unlabored  Chest Wall:    No tenderness or deformity   Heart:    Regular rate and rhythm, S1 and S2 normal, no murmur, rub   or gallop     Abdomen:     Soft, non-tender, bowel sounds active all four quadrants,    no masses, no organomegaly        Extremities:   Extremities normal, atraumatic, no cyanosis or edema  Pulses:   2+ and symmetric all extremities  Skin:   Skin color, texture, turgor normal, no rashes or lesions  Lymph nodes:   Cervical, supraclavicular, and axillary nodes normal  Neurologic:   CNII-XII intact, normal strength, sensation and reflexes    throughout    LABORATORY DATA:  Results for orders placed or performed in visit on 09/12/18 (from the past 48 hour(s))  CBC with Differential (Cancer Center Only)     Status: Abnormal   Collection Time: 09/12/18  1:00 PM  Result Value Ref Range   WBC Count 7.9 4.0 - 10.5 K/uL   RBC 4.72 3.87 - 5.11 MIL/uL    Comment: DIMORPHIC POPULATIONS   Hemoglobin 10.0 (L) 12.0 - 15.0 g/dL    Comment: Reticulocyte Hemoglobin testing may be clinically indicated, consider ordering this additional test [id number]    HCT 33.7 (L) 36.0 - 46.0 %   MCV  71.4 (L) 80.0 - 100.0 fL   MCH 21.2 (L) 26.0 - 34.0 pg   MCHC 29.7 (L) 30.0 - 36.0 g/dL   RDW Not Measured 11.5 - 15.5 %   Platelet Count 533 (H) 150 - 400 K/uL   nRBC 0.0 0.0 - 0.2 %   Neutrophils Relative % 66 %   Neutro Abs 5.2 1.7 - 7.7 K/uL   Lymphocytes Relative 27 %   Lymphs Abs 2.1 0.7 - 4.0 K/uL   Monocytes Relative 5 %   Monocytes Absolute 0.4 0.1 -  1.0 K/uL   Eosinophils Relative 1 %   Eosinophils Absolute 0.1 0.0 - 0.5 K/uL   Basophils Relative 1 %   Basophils Absolute 0.0 0.0 - 0.1 K/uL   Immature Granulocytes 0 %   Abs Immature Granulocytes 0.03 0.00 - 0.07 K/uL    Comment: Performed at [facility] at [facility], [address] [place], [place] [address]  Save Smear [facility])     Status: None   Collection Time: 09/12/18  1:00 PM  Result Value Ref Range   Smear Review SMEAR STAINED AND AVAILABLE FOR REVIEW     Comment: Performed at [facility] at [facility], [address], [place], [place] [address]  Reticulocytes     Status: Abnormal   Collection Time: 09/12/18  1:00 PM  Result Value Ref Range   Retic Ct Pct 0.8 0.4 - 3.1 %   RBC. 4.72 3.87 - 5.11 MIL/uL    Comment: DIMORPHIC POPULATIONS   Retic Count, Absolute 39.2 19.0 - 186.0 K/uL   Immature Retic Fract 18.6 (H) 2.3 - 15.9 %    Comment: Performed at [facility] at [facility], [address], [place], [place] [address]      RADIOGRAPHY: No results found.     PATHOLOGY: None  ASSESSMENT/PLAN: Tracey Copeland is avery pleasant 28 yo African American female with history of iron deficiency anemia secondary to heavy irregular cycle. She is currently on Lysteda and her 4 month long cycle stopped earlier this week. She continues to follow along closely with gynecology as she is also hoping to become pregnant.  She is feeling much better since her hospitalization last month where she received IV iron and 3 units of blood.  We will  have her start folic acid 1 mg PO daily.  We will see what her iron studies show and bring her back in for infusion if needed. Since she did have SOB and Feraheme we will order Injectafer for future infusions and premedicate as well.  Once we have her results we will schedule her follow-up.   All questions were answered and she is in agreement with the plan. She will contact our office with any questions or concerns. We can certainly see her sooner if necessary.  She was discussed with and also seen by Dr. Marin Olp and he is in agreement with the aforementioned.   Tracey Copeland      Addendum: I saw and examined Tracey Copeland with Judson Roch.  Agree with the above recommendations.  I will get her blood under the microscope.  She had some anisocytosis and poikilocytosis.  She had some hypochromic and microcytic red blood cells.  I suspect that she probably is iron deficient.  She has been on iron in the past.  We will probably have to resume kind of premedication given the potential for a reaction.  I will have to make sure that she gets Injectafer.  She comes in with her partner.  She does want to have a child.  We have to make sure that her blood is as good as possible.  Of note, her vitamin D is also quite low.  She probably needs to have some vitamin D supplementation.  We spent about 40 minutes with Tracey Copeland and her partner.  All the time spent face-to-face.  We did review our recommendations with her.  We answered all of her questions.  We counseled her and  helped arrange for follow-up.  We had a set of her iron infusions.  We will plan to get her back in about 6 weeks.  By then, she should have a nice increase in her MCV and her hemoglobin should be much better.  Her thrombocytosis also should be resolved.  Lattie Haw, MD

## 2018-09-13 LAB — VITAMIN D 25 HYDROXY (VIT D DEFICIENCY, FRACTURES): Vit D, 25-Hydroxy: 9 ng/mL — ABNORMAL LOW (ref 30.0–100.0)

## 2018-09-13 LAB — ERYTHROPOIETIN: Erythropoietin: 38.5 m[IU]/mL — ABNORMAL HIGH (ref 2.6–18.5)

## 2018-09-15 ENCOUNTER — Telehealth: Payer: Self-pay | Admitting: Family

## 2018-09-15 LAB — IRON AND TIBC
Iron: 18 ug/dL — ABNORMAL LOW (ref 41–142)
Saturation Ratios: 5 % — ABNORMAL LOW (ref 21–57)
TIBC: 344 ug/dL (ref 236–444)
UIBC: 325 ug/dL (ref 120–384)

## 2018-09-15 LAB — LACTATE DEHYDROGENASE: LDH: 189 U/L (ref 98–192)

## 2018-09-15 LAB — FERRITIN: Ferritin: 13 ng/mL (ref 11–307)

## 2018-09-15 NOTE — Telephone Encounter (Signed)
No LOS 3/6

## 2018-09-23 ENCOUNTER — Other Ambulatory Visit: Payer: Self-pay | Admitting: Family

## 2018-09-23 DIAGNOSIS — E559 Vitamin D deficiency, unspecified: Secondary | ICD-10-CM

## 2018-09-23 MED ORDER — ERGOCALCIFEROL 1.25 MG (50000 UT) PO CAPS: 50000.0000 [IU] | ORAL_CAPSULE | ORAL | 6 refills | Status: AC

## 2018-09-24 ENCOUNTER — Telehealth: Payer: Self-pay | Admitting: *Deleted

## 2018-09-24 ENCOUNTER — Telehealth: Payer: Self-pay | Admitting: Hematology & Oncology

## 2018-09-24 NOTE — Telephone Encounter (Signed)
notified pt to take VitD 50,000 unit 1x week. Pt verbalized understanding and confirmed upcoming appointments for injectafer.

## 2018-09-24 NOTE — Telephone Encounter (Signed)
Appointments scheduled patient notified per 3/17 sch msg

## 2018-09-24 NOTE — Telephone Encounter (Signed)
-----   Message from Eliezer Bottom, NP sent at 09/23/2018  3:19 PM EDT ----- Vitamin D and iron studies all quite low. She will need to start taking vitamin D 50,000 units once a week (script sent) and needs 2 doses of IV iron. She reacted on Feraheme and we will change her to [facility]. Follow-up in 6 weeks. Scheduling message sent to [facility].   Sarah ----- Message ----- From: Buel Ream, Lab In [place] Sent: 09/12/2018   1:20 PM EDT To: Eliezer Bottom, NP

## 2018-09-24 NOTE — Telephone Encounter (Signed)
-----   Message from Eliezer Bottom, NP sent at 09/23/2018  3:19 PM EDT ----- Vitamin D and iron studies all quite low. She will need to start taking vitamin D 50,000 units once a week (script sent) and needs 2 doses of IV iron. She reacted on Feraheme and we will change her to [facility]. Follow-up in 6 weeks. Scheduling message sent to [facility].   Sarah ----- Message ----- From: Buel Ream, Lab In [place] Sent: 09/12/2018   1:20 PM EDT To: Eliezer Bottom, NP

## 2018-09-30 ENCOUNTER — Ambulatory Visit: Payer: Medicaid Other

## 2018-10-07 ENCOUNTER — Inpatient Hospital Stay: Payer: Medicaid Other

## 2018-10-07 ENCOUNTER — Other Ambulatory Visit: Payer: Self-pay

## 2018-10-07 VITALS — BP 114/78 | HR 66 | Temp 98.0°F | Resp 18

## 2018-10-07 DIAGNOSIS — N921 Excessive and frequent menstruation with irregular cycle: Secondary | ICD-10-CM

## 2018-10-07 DIAGNOSIS — D500 Iron deficiency anemia secondary to blood loss (chronic): Secondary | ICD-10-CM

## 2018-10-07 MED ORDER — SODIUM CHLORIDE 0.9 % IV SOLN
Freq: Once | INTRAVENOUS | Status: AC
  Administered 2018-10-07: 14:00:00 via INTRAVENOUS
  Filled 2018-10-07: qty 250

## 2018-10-07 MED ORDER — METHYLPREDNISOLONE SODIUM SUCC 125 MG IJ SOLR
80.0000 mg | Freq: Once | INTRAMUSCULAR | Status: AC
  Administered 2018-10-07: 80 mg via INTRAVENOUS

## 2018-10-07 MED ORDER — METHYLPREDNISOLONE SODIUM SUCC 125 MG IJ SOLR
INTRAMUSCULAR | Status: AC
  Filled 2018-10-07: qty 2

## 2018-10-07 MED ORDER — SODIUM CHLORIDE 0.9 % IV SOLN
20.0000 mg | Freq: Once | INTRAVENOUS | Status: DC
  Filled 2018-10-07: qty 2

## 2018-10-07 MED ORDER — SODIUM CHLORIDE 0.9 % IV SOLN
20.0000 mg | Freq: Once | INTRAVENOUS | Status: AC
  Administered 2018-10-07: 20 mg via INTRAVENOUS
  Filled 2018-10-07: qty 2

## 2018-10-07 MED ORDER — SODIUM CHLORIDE 0.9 % IV SOLN: 80.0000 mg | Freq: Once | INTRAVENOUS | Status: DC

## 2018-10-07 MED ORDER — SODIUM CHLORIDE 0.9 % IV SOLN
750.0000 mg | Freq: Once | INTRAVENOUS | Status: AC
  Administered 2018-10-07: 750 mg via INTRAVENOUS
  Filled 2018-10-07: qty 15

## 2018-10-07 MED ORDER — FAMOTIDINE IN NACL 20-0.9 MG/50ML-% IV SOLN: 20.0000 mg | Freq: Once | INTRAVENOUS | Status: DC

## 2018-10-07 NOTE — Patient Instructions (Addendum)
Ferric carboxymaltose injection What is this medicine? FERRIC CARBOXYMALTOSE (ferr-ik car-box-ee-mol-toes) is an iron complex. Iron is used to make healthy red blood cells, which carry oxygen and nutrients throughout the body. This medicine is used to treat anemia in people with chronic kidney disease or people who cannot take iron by mouth. This medicine may be used for other purposes; ask your health care provider or pharmacist if you have questions. COMMON BRAND NAME(S): Injectafer What should I tell my health care provider before I take this medicine? They need to know if you have any of these conditions: -high levels of iron in the blood -liver disease -an unusual or allergic reaction to iron, other medicines, foods, dyes, or preservatives -pregnant or trying to get pregnant -breast-feeding How should I use this medicine? This medicine is for infusion into a vein. It is given by a health care professional in a hospital or clinic setting. Talk to your pediatrician regarding the use of this medicine in children. Special care may be needed. Overdosage: If you think you have taken too much of this medicine contact a poison control center or emergency room at once. NOTE: This medicine is only for you. Do not share this medicine with others. What if I miss a dose? It is important not to miss your dose. Call your doctor or health care professional if you are unable to keep an appointment. What may interact with this medicine? Do not take this medicine with any of the following medications: -deferoxamine -dimercaprol -other iron products This list may not describe all possible interactions. Give your health care provider a list of all the medicines, herbs, non-prescription drugs, or dietary supplements you use. Also tell them if you smoke, drink alcohol, or use illegal drugs. Some items may interact with your medicine. What should I watch for while using this medicine? Visit your doctor or  health care professional regularly. Tell your doctor if your symptoms do not start to get better or if they get worse. You may need blood work done while you are taking this medicine. You may need to follow a special diet. Talk to your doctor. Foods that contain iron include: whole grains/cereals, dried fruits, beans, or peas, leafy green vegetables, and organ meats (liver, kidney). What side effects may I notice from receiving this medicine? Side effects that you should report to your doctor or health care professional as soon as possible: -allergic reactions like skin rash, itching or hives, swelling of the face, lips, or tongue -dizziness -facial flushing Side effects that usually do not require medical attention (report to your doctor or health care professional if they continue or are bothersome): -changes in taste -constipation -headache -nausea, vomiting -pain, redness, or irritation at site where injected This list may not describe all possible side effects. Call your doctor for medical advice about side effects. You may report side effects to FDA at 1-800-FDA-1088. Where should I keep my medicine? This drug is given in a hospital or clinic and will not be stored at home. NOTE: This sheet is a summary. It may not cover all possible information. If you have questions about this medicine, talk to your doctor, pharmacist, or health care provider.  2019 Elsevier/Gold Standard (2016-08-09 09:40:29) Ferric carboxymaltose injection What is this medicine? FERRIC CARBOXYMALTOSE (ferr-ik car-box-ee-mol-toes) is an iron complex. Iron is used to make healthy red blood cells, which carry oxygen and nutrients throughout the body. This medicine is used to treat anemia in people with chronic kidney disease or people   who cannot take iron by mouth. This medicine may be used for other purposes; ask your health care provider or pharmacist if you have questions. COMMON BRAND NAME(S): Injectafer What  should I tell my health care provider before I take this medicine? They need to know if you have any of these conditions: -high levels of iron in the blood -liver disease -an unusual or allergic reaction to iron, other medicines, foods, dyes, or preservatives -pregnant or trying to get pregnant -breast-feeding How should I use this medicine? This medicine is for infusion into a vein. It is given by a health care professional in a hospital or clinic setting. Talk to your pediatrician regarding the use of this medicine in children. Special care may be needed. Overdosage: If you think you have taken too much of this medicine contact a poison control center or emergency room at once. NOTE: This medicine is only for you. Do not share this medicine with others. What if I miss a dose? It is important not to miss your dose. Call your doctor or health care professional if you are unable to keep an appointment. What may interact with this medicine? Do not take this medicine with any of the following medications: -deferoxamine -dimercaprol -other iron products This list may not describe all possible interactions. Give your health care provider a list of all the medicines, herbs, non-prescription drugs, or dietary supplements you use. Also tell them if you smoke, drink alcohol, or use illegal drugs. Some items may interact with your medicine. What should I watch for while using this medicine? Visit your doctor or health care professional regularly. Tell your doctor if your symptoms do not start to get better or if they get worse. You may need blood work done while you are taking this medicine. You may need to follow a special diet. Talk to your doctor. Foods that contain iron include: whole grains/cereals, dried fruits, beans, or peas, leafy green vegetables, and organ meats (liver, kidney). What side effects may I notice from receiving this medicine? Side effects that you should report to your doctor or  health care professional as soon as possible: -allergic reactions like skin rash, itching or hives, swelling of the face, lips, or tongue -dizziness -facial flushing Side effects that usually do not require medical attention (report to your doctor or health care professional if they continue or are bothersome): -changes in taste -constipation -headache -nausea, vomiting -pain, redness, or irritation at site where injected This list may not describe all possible side effects. Call your doctor for medical advice about side effects. You may report side effects to FDA at 1-800-FDA-1088. Where should I keep my medicine? This drug is given in a hospital or clinic and will not be stored at home. NOTE: This sheet is a summary. It may not cover all possible information. If you have questions about this medicine, talk to your doctor, pharmacist, or health care provider.  2019 Elsevier/Gold Standard (2016-08-09 09:40:29)  

## 2018-11-04 ENCOUNTER — Inpatient Hospital Stay: Payer: Medicaid Other | Attending: Hematology & Oncology | Admitting: Family

## 2018-11-04 ENCOUNTER — Inpatient Hospital Stay: Payer: Medicaid Other

## 2018-11-04 ENCOUNTER — Other Ambulatory Visit: Payer: Self-pay

## 2018-11-04 VITALS — BP 102/60 | HR 74 | Temp 98.0°F | Resp 17 | Ht 61.0 in | Wt 238.8 lb

## 2018-11-04 DIAGNOSIS — D500 Iron deficiency anemia secondary to blood loss (chronic): Secondary | ICD-10-CM | POA: Diagnosis not present

## 2018-11-04 DIAGNOSIS — N921 Excessive and frequent menstruation with irregular cycle: Secondary | ICD-10-CM

## 2018-11-04 DIAGNOSIS — D649 Anemia, unspecified: Secondary | ICD-10-CM

## 2018-11-04 DIAGNOSIS — Z79899 Other long term (current) drug therapy: Secondary | ICD-10-CM | POA: Diagnosis not present

## 2018-11-04 DIAGNOSIS — N920 Excessive and frequent menstruation with regular cycle: Secondary | ICD-10-CM | POA: Diagnosis not present

## 2018-11-04 LAB — CBC WITH DIFFERENTIAL (CANCER CENTER ONLY)
Abs Immature Granulocytes: 0.04 10*3/uL (ref 0.00–0.07)
Basophils Absolute: 0 10*3/uL (ref 0.0–0.1)
Basophils Relative: 1 %
Eosinophils Absolute: 0.1 10*3/uL (ref 0.0–0.5)
Eosinophils Relative: 1 %
HCT: 35.9 % — ABNORMAL LOW (ref 36.0–46.0)
Hemoglobin: 11 g/dL — ABNORMAL LOW (ref 12.0–15.0)
Immature Granulocytes: 1 %
Lymphocytes Relative: 27 %
Lymphs Abs: 2 10*3/uL (ref 0.7–4.0)
MCH: 23.1 pg — ABNORMAL LOW (ref 26.0–34.0)
MCHC: 30.6 g/dL (ref 30.0–36.0)
MCV: 75.4 fL — ABNORMAL LOW (ref 80.0–100.0)
Monocytes Absolute: 0.4 10*3/uL (ref 0.1–1.0)
Monocytes Relative: 6 %
Neutro Abs: 4.9 10*3/uL (ref 1.7–7.7)
Neutrophils Relative %: 64 %
Platelet Count: 337 10*3/uL (ref 150–400)
RBC: 4.76 MIL/uL (ref 3.87–5.11)
RDW: 23.7 % — ABNORMAL HIGH (ref 11.5–15.5)
WBC Count: 7.4 10*3/uL (ref 4.0–10.5)
nRBC: 0 % (ref 0.0–0.2)

## 2018-11-04 LAB — RETICULOCYTES
Immature Retic Fract: 17.3 % — ABNORMAL HIGH (ref 2.3–15.9)
RBC.: 4.76 MIL/uL (ref 3.87–5.11)
Retic Count, Absolute: 112.8 10*3/uL (ref 19.0–186.0)
Retic Ct Pct: 2.4 % (ref 0.4–3.1)

## 2018-11-04 NOTE — Progress Notes (Addendum)
Hematology and Oncology Follow Up Visit  Tracey Copeland [id number] 1991-05-30 28 y.o. 30 11/04/2018   Principle Diagnosis:  Iron deficiency anemia secondary to heavy cycle Alpha thalassemia   Current Therapy:   IV iron as indicated  Folic acid 1 mg PO daily   Interim History:  Tracey Copeland is here today for follow-up. She is still symptomatic with fatigue, dizziness and occasional palpitations when she is laying down.  She is currently on her cycle which she states is heavy with clots. She is still taking the Lysteda and plans to call her gynecologist today and let them know about the continued heavy bleeding.  She states that she has had some abdominal cramping and her breasts are tender. She was concerned about a possible right breast lump and the 6 o'clock position. Breast exam today was negative. No mass, lesion or rash noted in either breast.   No other episodes of bleeding, no bruising or petechiae.  She states that she is also taking an oral iron supplement daily.  No fever, chills, n/v, cough, rash, SOB, chest pain or changes in bowel or bladder habits.  No swelling, tenderness, numbness or tingling in her extremities.  No lymphadenopathy noted on exam.  She has maintained a good appetite but states that she needs to drink more water daily.   ECOG Performance Status: 1 - Symptomatic but completely ambulatory  Medications:  Allergies as of 11/04/2018      Reactions   Feraheme [ferumoxytol] Shortness Of Breath   Onion Itching, Swelling, Rash      Medication List       Accurate as of November 04, 2018 12:10 PM. Always use your most recent med list.        albuterol 108 (90 Base) MCG/ACT inhaler Commonly known as:  VENTOLIN HFA Inhale 2 puffs into the lungs every 6 (six) hours as needed for wheezing or shortness of breath.   ergocalciferol 1.25 MG (50000 UT) capsule Commonly known as:  VITAMIN D2 Take 1 capsule (50,000 Units total) by mouth once a week.   folic  acid 1 MG tablet Commonly known as:  FOLVITE Take 1 tablet (1 mg total) by mouth daily.   ibuprofen 200 MG tablet Commonly known as:  ADVIL Take 400 mg by mouth every 6 (six) hours as needed for headache, mild pain or moderate pain.   methocarbamol 500 MG tablet Commonly known as:  ROBAXIN Take 1 tablet (500 mg total) by mouth 2 (two) times daily.   naproxen 500 MG tablet Commonly known as:  NAPROSYN Take 1 tablet (500 mg total) by mouth 2 (two) times daily.   ONE-A-DAY WOMENS PO Take 1 tablet by mouth daily.   tranexamic acid 650 MG Tabs tablet Commonly known as:  LYSTEDA Take 650 mg by mouth 2 (two) times daily.       Allergies:  Allergies  Allergen Reactions  . Feraheme [Ferumoxytol] Shortness Of Breath  . Onion Itching, Swelling and Rash    Past Medical History, Surgical history, Social history, and Family History were reviewed and updated.  Review of Systems: All other 10 point review of systems is negative.   Physical Exam:  height is 5\' 1"  (1.549 m) and weight is 238 lb 12.8 oz (108.3 kg). Her oral temperature is 98 F (36.7 C). Her blood pressure is 102/60 and her pulse is 74. Her respiration is 17 and oxygen saturation is 100%.   Wt Readings from Last 3 Encounters:  11/04/18 238 lb 12.8 oz (108.3  kg)  09/03/18 227 lb (103 kg)  08/25/18 220 lb 3.8 oz (99.9 kg)    Ocular: Sclerae unicteric, pupils equal, round and reactive to light Ear-nose-throat: Oropharynx clear, dentition fair Lymphatic: No cervical or supraclavicular adenopathy Lungs no rales or rhonchi, good excursion bilaterally Heart regular rate and rhythm, no murmur appreciated Abd soft, nontender, positive bowel sounds, no liver or spleen tip palpated on exam, no fluid wave  MSK no focal spinal tenderness, no joint edema Neuro: non-focal, well-oriented, appropriate affect Breasts: No mass, lesion or rash, noted.   Lab Results  Component Value Date   WBC 7.4 11/04/2018   HGB 11.0 (L)  11/04/2018   HCT 35.9 (L) 11/04/2018   MCV 75.4 (L) 11/04/2018   PLT 337 11/04/2018   Lab Results  Component Value Date   FERRITIN 13 09/12/2018   IRON 18 (L) 09/12/2018   TIBC 344 09/12/2018   UIBC 325 09/12/2018   IRONPCTSAT 5 (L) 09/12/2018   Lab Results  Component Value Date   RETICCTPCT 2.4 11/04/2018   RBC 4.76 11/04/2018   RBC 4.76 11/04/2018   No results found for: KPAFRELGTCHN, LAMBDASER, KAPLAMBRATIO No results found for: IGGSERUM, IGA, IGMSERUM No results found for: Kathrynn Ducking, MSPIKE, SPEI   Chemistry      Component Value Date/Time   NA 139 09/12/2018 1300   K 4.0 09/12/2018 1300   CL 105 09/12/2018 1300   CO2 28 09/12/2018 1300   BUN 9 09/12/2018 1300   CREATININE 0.77 09/12/2018 1300      Component Value Date/Time   CALCIUM 9.2 09/12/2018 1300   ALKPHOS 97 09/12/2018 1300   AST 11 (L) 09/12/2018 1300   ALT 13 09/12/2018 1300   BILITOT 0.2 (L) 09/12/2018 1300       Impression and Plan: Tracey Copeland is a very pleasant 28 yo African 28  American female with iron deficiency anemia secondary to heavy cycles.  She is working with her gynecologist to regulate her cycle.  She is still symptomatic as mentioned above.  We will see what her iron studies show and bring her back in for infusion if needed.  We will go ahead and plan to see her back in 8 weeks.  She will contact our office with any questions or concerns. We can certainly see her sooner if need be.   Laverna Peace, NP 4/28/202012:10 PM

## 2018-11-05 ENCOUNTER — Ambulatory Visit: Payer: Medicaid Other

## 2018-11-05 LAB — IRON AND TIBC
Iron: 32 ug/dL — ABNORMAL LOW (ref 41–142)
Saturation Ratios: 13 % — ABNORMAL LOW (ref 21–57)
TIBC: 258 ug/dL (ref 236–444)
UIBC: 225 ug/dL (ref 120–384)

## 2018-11-05 LAB — FERRITIN: Ferritin: 65 ng/mL (ref 11–307)

## 2018-11-06 ENCOUNTER — Telehealth: Payer: Self-pay | Admitting: Hematology & Oncology

## 2018-11-06 LAB — HEMOGLOBINOPATHY EVALUATION
Hgb A2 Quant: 2 % (ref 1.8–3.2)
Hgb A: 98 % (ref 96.4–98.8)
Hgb C: 0 %
Hgb F Quant: 0 % (ref 0.0–2.0)
Hgb S Quant: 0 %
Hgb Variant: 0 %

## 2018-11-06 NOTE — Telephone Encounter (Signed)
Appointments scheduled letter/calendar mailed  °

## 2018-11-11 ENCOUNTER — Inpatient Hospital Stay: Payer: Medicaid Other | Attending: Hematology & Oncology

## 2018-11-11 ENCOUNTER — Other Ambulatory Visit: Payer: Self-pay

## 2018-11-11 VITALS — BP 129/68 | HR 63 | Temp 98.3°F | Resp 18

## 2018-11-11 DIAGNOSIS — D500 Iron deficiency anemia secondary to blood loss (chronic): Secondary | ICD-10-CM | POA: Diagnosis present

## 2018-11-11 DIAGNOSIS — N921 Excessive and frequent menstruation with irregular cycle: Secondary | ICD-10-CM

## 2018-11-11 DIAGNOSIS — N920 Excessive and frequent menstruation with regular cycle: Secondary | ICD-10-CM | POA: Diagnosis present

## 2018-11-11 MED ORDER — METHYLPREDNISOLONE SODIUM SUCC 40 MG IJ SOLR
INTRAMUSCULAR | Status: AC
  Filled 2018-11-11: qty 2

## 2018-11-11 MED ORDER — SODIUM CHLORIDE 0.9 % IV SOLN
750.0000 mg | Freq: Once | INTRAVENOUS | Status: AC
  Administered 2018-11-11: 750 mg via INTRAVENOUS
  Filled 2018-11-11: qty 15

## 2018-11-11 MED ORDER — SODIUM CHLORIDE 0.9 % IV SOLN
Freq: Once | INTRAVENOUS | Status: AC
  Administered 2018-11-11: 13:00:00 via INTRAVENOUS
  Filled 2018-11-11: qty 250

## 2018-11-11 MED ORDER — SODIUM CHLORIDE 0.9 % IV SOLN
80.0000 mg | Freq: Once | INTRAVENOUS | Status: DC
  Administered 2018-11-11: 80 mg via INTRAVENOUS
  Filled 2018-11-11: qty 0.64

## 2018-11-11 MED ORDER — FAMOTIDINE IN NACL 20-0.9 MG/50ML-% IV SOLN
INTRAVENOUS | Status: AC
  Filled 2018-11-11: qty 50

## 2018-11-11 MED ORDER — FAMOTIDINE IN NACL 20-0.9 MG/50ML-% IV SOLN
20.0000 mg | Freq: Once | INTRAVENOUS | Status: AC
  Administered 2018-11-11: 20 mg via INTRAVENOUS

## 2018-11-11 NOTE — Patient Instructions (Signed)

## 2018-11-12 LAB — ALPHA-THALASSEMIA GENOTYPR

## 2019-01-07 ENCOUNTER — Inpatient Hospital Stay: Payer: Medicaid Other | Admitting: Family

## 2019-01-07 ENCOUNTER — Inpatient Hospital Stay: Payer: Medicaid Other

## 2019-01-15 ENCOUNTER — Inpatient Hospital Stay: Payer: Medicaid Other | Admitting: Family

## 2019-01-15 ENCOUNTER — Inpatient Hospital Stay: Payer: Medicaid Other | Attending: Hematology & Oncology

## 2019-05-23 ENCOUNTER — Other Ambulatory Visit: Payer: Self-pay

## 2019-05-23 ENCOUNTER — Encounter (HOSPITAL_BASED_OUTPATIENT_CLINIC_OR_DEPARTMENT_OTHER): Payer: Self-pay | Admitting: *Deleted

## 2019-05-23 ENCOUNTER — Emergency Department (HOSPITAL_BASED_OUTPATIENT_CLINIC_OR_DEPARTMENT_OTHER): Payer: Medicaid Other

## 2019-05-23 ENCOUNTER — Emergency Department (HOSPITAL_BASED_OUTPATIENT_CLINIC_OR_DEPARTMENT_OTHER)
Admission: EM | Admit: 2019-05-23 | Discharge: 2019-05-23 | Disposition: A | Discharge status: Home / Self Care | Payer: Medicaid Other | Attending: Emergency Medicine | Admitting: Emergency Medicine

## 2019-05-23 DIAGNOSIS — R6881 Early satiety: Secondary | ICD-10-CM | POA: Diagnosis not present

## 2019-05-23 DIAGNOSIS — J45909 Unspecified asthma, uncomplicated: Secondary | ICD-10-CM | POA: Diagnosis not present

## 2019-05-23 DIAGNOSIS — R0602 Shortness of breath: Secondary | ICD-10-CM | POA: Diagnosis not present

## 2019-05-23 DIAGNOSIS — R42 Dizziness and giddiness: Secondary | ICD-10-CM | POA: Insufficient documentation

## 2019-05-23 DIAGNOSIS — Z79899 Other long term (current) drug therapy: Secondary | ICD-10-CM | POA: Diagnosis not present

## 2019-05-23 DIAGNOSIS — N939 Abnormal uterine and vaginal bleeding, unspecified: Secondary | ICD-10-CM | POA: Insufficient documentation

## 2019-05-23 LAB — COMPREHENSIVE METABOLIC PANEL
ALT: 16 U/L (ref 0–44)
AST: 13 U/L — ABNORMAL LOW (ref 15–41)
Albumin: 3.7 g/dL (ref 3.5–5.0)
Alkaline Phosphatase: 102 U/L (ref 38–126)
Anion gap: 7 (ref 5–15)
BUN: 16 mg/dL (ref 6–20)
CO2: 25 mmol/L (ref 22–32)
Calcium: 8.6 mg/dL — ABNORMAL LOW (ref 8.9–10.3)
Chloride: 105 mmol/L (ref 98–111)
Creatinine, Ser: 0.92 mg/dL (ref 0.44–1.00)
GFR calc Af Amer: >60 mL/min (ref >60)
GFR calc non Af Amer: >60 mL/min (ref >60)
Glucose, Bld: 109 mg/dL — ABNORMAL HIGH (ref 70–99)
Potassium: 3.7 mmol/L (ref 3.5–5.1)
Sodium: 137 mmol/L (ref 135–145)
Total Bilirubin: 0.4 mg/dL (ref 0.3–1.2)
Total Protein: 7.5 g/dL (ref 6.5–8.1)

## 2019-05-23 LAB — PREGNANCY, URINE: Preg Test, Ur: NEGATIVE

## 2019-05-23 LAB — CBC WITH DIFFERENTIAL/PLATELET
Abs Immature Granulocytes: 0.03 10*3/uL (ref 0.00–0.07)
Basophils Absolute: 0.1 10*3/uL (ref 0.0–0.1)
Basophils Relative: 1 %
Eosinophils Absolute: 0.1 10*3/uL (ref 0.0–0.5)
Eosinophils Relative: 1 %
HCT: 43.1 % (ref 36.0–46.0)
Hemoglobin: 13.5 g/dL (ref 12.0–15.0)
Immature Granulocytes: 0 %
Lymphocytes Relative: 37 %
Lymphs Abs: 3.7 10*3/uL (ref 0.7–4.0)
MCH: 25.6 pg — ABNORMAL LOW (ref 26.0–34.0)
MCHC: 31.3 g/dL (ref 30.0–36.0)
MCV: 81.6 fL (ref 80.0–100.0)
Monocytes Absolute: 0.7 10*3/uL (ref 0.1–1.0)
Monocytes Relative: 7 %
Neutro Abs: 5.5 10*3/uL (ref 1.7–7.7)
Neutrophils Relative %: 54 %
Platelets: 332 10*3/uL (ref 150–400)
RBC: 5.28 MIL/uL — ABNORMAL HIGH (ref 3.87–5.11)
RDW: 13.9 % (ref 11.5–15.5)
WBC: 9.9 10*3/uL (ref 4.0–10.5)
nRBC: 0 % (ref 0.0–0.2)

## 2019-05-23 LAB — URINALYSIS, ROUTINE W REFLEX MICROSCOPIC
Bilirubin Urine: NEGATIVE
Glucose, UA: NEGATIVE mg/dL
Ketones, ur: NEGATIVE mg/dL
Leukocytes,Ua: NEGATIVE
Nitrite: NEGATIVE
Protein, ur: NEGATIVE mg/dL
Specific Gravity, Urine: 1.02 (ref 1.005–1.030)
pH: 7 (ref 5.0–8.0)

## 2019-05-23 LAB — URINALYSIS, MICROSCOPIC (REFLEX)

## 2019-05-23 LAB — LIPASE, BLOOD: Lipase: 35 U/L (ref 11–51)

## 2019-05-23 NOTE — ED Triage Notes (Signed)
Pt reporting she had a Depo shot about 3 months ago, and she has had vaginal bleeding since then. About 2 weeks ago she started feeling fatigued and dizzy. She reports she changes a pad about every 2 hours.

## 2019-05-23 NOTE — ED Provider Notes (Signed)
[facility] EMERGENCY DEPARTMENT Provider Note   CSN: [id number] Arrival date & time: 05/23/19  [id number]     History   Chief Complaint Chief Complaint  Patient presents with  . Vaginal Bleeding    HPI Tracey Copeland is a 28 y.o. female.     28 yo F with a chief complaints of early satiety shortness of breath and feeling lightheaded.  This occurred this evening.  The patient thinks that she may be anemic because she has had chronic problems with vaginal bleeding and feels it is worsened over the past couple weeks.  States that she was trying to eat dinner and only took a couple bites and felt she was full.  She then went and took a shower and felt a bit lightheaded.  She thought this could have been due to anemia and discussed it with her mother who told her to come to the ED to be evaluated.  Denies cough congestion or fever denies vomiting denies diarrhea.  Denies any significant change in her vaginal bleeding this evening.  The history is provided by the patient.  Vaginal Bleeding Quality:  Bright red and typical of menses Severity:  Moderate Onset quality:  Gradual Duration:  2 weeks Timing:  Constant Progression:  Unchanged Chronicity:  Recurrent Possible pregnancy: no   Relieved by:  Nothing Worsened by:  Nothing Ineffective treatments:  None tried Associated symptoms: nausea   Associated symptoms: no abdominal pain, no dizziness, no dysuria and no fever     Past Medical History:  Diagnosis Date  . Anemia   . Asthma   . Bipolar 1 disorder ([place])   . Blood transfusion without reported diagnosis   . Schizophrenia [facility])     Patient Active Problem List   Diagnosis Date Noted  . IDA (iron deficiency anemia) 09/11/2018  . Symptomatic anemia 08/24/2018  . Menorrhagia 03/23/2018  . Anemia 03/23/2018    Past Surgical History:  Procedure Laterality Date  . DILATION AND CURETTAGE OF UTERUS       OB History   No obstetric history on file.       Home Medications    Prior to Admission medications   Medication Sig Start Date End Date Taking? Authorizing Provider  albuterol (PROVENTIL HFA;VENTOLIN HFA) 108 (90 Base) MCG/ACT inhaler Inhale 2 puffs into the lungs every 6 (six) hours as needed for wheezing or shortness of breath.   Yes [provider]  ergocalciferol (VITAMIN D2) 1.25 MG (50000 UT) capsule Take 1 capsule (50,000 Units total) by mouth once a week. 09/23/18  Yes [place], Holli Humbles, NP  folic acid (FOLVITE) 1 MG tablet Take 1 tablet (1 mg total) by mouth daily. 09/12/18  Yes [place], Holli Humbles, NP  Multiple Vitamins-Calcium (ONE-A-DAY WOMENS PO) Take 1 tablet by mouth daily.   Yes [provider]  ibuprofen (ADVIL,MOTRIN) 200 MG tablet Take 400 mg by mouth every 6 (six) hours as needed for headache, mild pain or moderate pain.    [provider]  methocarbamol (ROBAXIN) 500 MG tablet Take 1 tablet (500 mg total) by mouth 2 (two) times daily. 09/03/18   Darlin Drop P, PA-C  naproxen (NAPROSYN) 500 MG tablet Take 1 tablet (500 mg total) by mouth 2 (two) times daily. 09/03/18   Darlin Drop P, PA-C  tranexamic acid (LYSTEDA) 650 MG TABS tablet Take 650 mg by mouth 2 (two) times daily.     [provider]    Family History No family history on file.  Social History Social History   Tobacco Use  . Smoking status: Never Smoker  . Smokeless tobacco: Never Used  Substance Use Topics  . Alcohol use: No  . Drug use: No     Allergies   Feraheme [ferumoxytol] and Onion   Review of Systems Review of Systems  Constitutional: Negative for chills and fever.  HENT: Negative for congestion and rhinorrhea.   Eyes: Negative for redness and visual disturbance.  Respiratory: Negative for shortness of breath and wheezing.   Cardiovascular: Negative for chest pain and palpitations.  Gastrointestinal: Positive for nausea. Negative for abdominal pain and vomiting.  Genitourinary: Positive for  vaginal bleeding. Negative for dysuria and urgency.  Musculoskeletal: Negative for arthralgias and myalgias.  Skin: Negative for pallor and wound.  Neurological: Positive for light-headedness. Negative for dizziness and headaches.     Physical Exam Updated Vital Signs BP 121/66 (BP Location: Right Arm)   Pulse 72   Temp 98.3 F (36.8 C) (Oral)   Resp 18   SpO2 97%   Physical Exam Vitals signs and nursing note reviewed.  Constitutional:      General: She is not in acute distress.    Appearance: She is well-developed. She is obese. She is not diaphoretic.  HENT:     Head: Normocephalic and atraumatic.  Eyes:     Pupils: Pupils are equal, round, and reactive to light.  Neck:     Musculoskeletal: Normal range of motion and neck supple.  Cardiovascular:     Rate and Rhythm: Normal rate and regular rhythm.     Heart sounds: No murmur. No friction rub. No gallop.   Pulmonary:     Effort: Pulmonary effort is normal.     Breath sounds: No wheezing or rales.  Abdominal:     General: There is no distension.     Palpations: Abdomen is soft.     Tenderness: There is no abdominal tenderness.  Musculoskeletal:        General: No tenderness.  Skin:    General: Skin is warm and dry.  Neurological:     Mental Status: She is alert and oriented to person, place, and time.  Psychiatric:        Behavior: Behavior normal.      ED Treatments / Results  Labs (all labs ordered are listed, but only abnormal results are displayed) Labs Reviewed  CBC WITH DIFFERENTIAL/PLATELET - Abnormal; Notable for the following components:      Result Value   RBC 5.28 (*)    MCH 25.6 (*)    All other components within normal limits  URINALYSIS, ROUTINE W REFLEX MICROSCOPIC - Abnormal; Notable for the following components:   APPearance HAZY (*)    Hgb urine dipstick LARGE (*)    All other components within normal limits  COMPREHENSIVE METABOLIC PANEL - Abnormal; Notable for the following  components:   Glucose, Bld 109 (*)    Calcium 8.6 (*)    AST 13 (*)    All other components within normal limits  URINALYSIS, MICROSCOPIC (REFLEX) - Abnormal; Notable for the following components:   Bacteria, UA FEW (*)    All other components within normal limits  PREGNANCY, URINE  LIPASE, BLOOD    EKG EKG Interpretation  Date/Time:  Saturday May 23 2019 05:02:30 EST Ventricular Rate:  72 PR Interval:    QRS Duration: 90 QT Interval:  392 QTC Calculation: 429 R Axis:   42 Text Interpretation: Sinus rhythm No significant change since last  tracing Confirmed by Deno Etienne [phone]) on 05/23/2019 5:13:15 AM   Radiology Dg Chest Port 1 View  Result Date: 05/23/2019 CLINICAL DATA:  Short of breath. EXAM: PORTABLE CHEST 1 VIEW COMPARISON:  08/24/2018 FINDINGS: The heart size and mediastinal contours are within normal limits. Both lungs are clear. The visualized skeletal structures are unremarkable. IMPRESSION: No active disease. Electronically Signed   By: Kerby Moors M.D.   On: 05/23/2019 05:03    Procedures Procedures (including critical care time)  Medications Ordered in ED Medications - No data to display   Initial Impression / Assessment and Plan / ED Course  I have reviewed the triage vital signs and the nursing notes.  Pertinent labs & imaging results that were available during my care of the patient were reviewed by me and considered in my medical decision making (see chart for details).        28 yo F with a chief complaints of not letting to eat dinner.  Patient also felt lightheaded when she was taking a shower.  She is concerned this may be related to her vaginal bleeding which has been an ongoing issue for her.  Will obtain lab work.  Reassess.  Labwork unremarkable, the patient's hemoglobin is normal.  LFTs and lipase are unremarkable.  Patient is not pregnant and urine without infection chest x-ray viewed by me without focal infiltrate or pneumothorax.   5:22 AM:  I have discussed the diagnosis/risks/treatment options with the patient and believe the pt to be eligible for discharge home to follow-up with PCP. We also discussed returning to the ED immediately if new or worsening sx occur. We discussed the sx which are most concerning (e.g., sudden worsening pain, fever, inability to tolerate by mouth) that necessitate immediate return. Medications administered to the patient during their visit and any new prescriptions provided to the patient are listed below.  Medications given during this visit Medications - No data to display   The patient appears reasonably screen and/or stabilized for discharge and I doubt any other medical condition or other [facility] requiring further screening, evaluation, or treatment in the ED at this time prior to discharge.    Final Clinical Impressions(s) / ED Diagnoses   Final diagnoses:  Vaginal bleeding  Lightheaded  Early satiety    ED Discharge Orders    None       Deno Etienne, DO 05/23/19 0522

## 2019-05-23 NOTE — Discharge Instructions (Signed)
Your lab work is reassuring.  This could be signs of an early viral illness.  Please return for worsening shortness of breath inability to eat or drink or fever.

## 2019-05-23 NOTE — ED Notes (Signed)
ED Provider at bedside. 

## 2019-06-17 ENCOUNTER — Emergency Department (HOSPITAL_BASED_OUTPATIENT_CLINIC_OR_DEPARTMENT_OTHER)
Admission: EM | Admit: 2019-06-17 | Discharge: 2019-06-17 | Disposition: A | Discharge status: Home / Self Care | Payer: Medicaid Other | Attending: Emergency Medicine | Admitting: Emergency Medicine

## 2019-06-17 ENCOUNTER — Other Ambulatory Visit: Payer: Self-pay

## 2019-06-17 ENCOUNTER — Encounter (HOSPITAL_BASED_OUTPATIENT_CLINIC_OR_DEPARTMENT_OTHER): Payer: Self-pay | Admitting: Emergency Medicine

## 2019-06-17 ENCOUNTER — Emergency Department (HOSPITAL_BASED_OUTPATIENT_CLINIC_OR_DEPARTMENT_OTHER): Payer: Medicaid Other

## 2019-06-17 DIAGNOSIS — S161XXA Strain of muscle, fascia and tendon at neck level, initial encounter: Secondary | ICD-10-CM

## 2019-06-17 DIAGNOSIS — J45909 Unspecified asthma, uncomplicated: Secondary | ICD-10-CM | POA: Diagnosis not present

## 2019-06-17 DIAGNOSIS — S199XXA Unspecified injury of neck, initial encounter: Secondary | ICD-10-CM | POA: Diagnosis present

## 2019-06-17 DIAGNOSIS — R519 Headache, unspecified: Secondary | ICD-10-CM | POA: Insufficient documentation

## 2019-06-17 DIAGNOSIS — Y9389 Activity, other specified: Secondary | ICD-10-CM | POA: Insufficient documentation

## 2019-06-17 DIAGNOSIS — Y999 Unspecified external cause status: Secondary | ICD-10-CM | POA: Insufficient documentation

## 2019-06-17 DIAGNOSIS — Z79899 Other long term (current) drug therapy: Secondary | ICD-10-CM | POA: Diagnosis not present

## 2019-06-17 DIAGNOSIS — Y92410 Unspecified street and highway as the place of occurrence of the external cause: Secondary | ICD-10-CM | POA: Diagnosis not present

## 2019-06-17 LAB — PREGNANCY, URINE: Preg Test, Ur: NEGATIVE

## 2019-06-17 MED ORDER — HYDROCODONE-ACETAMINOPHEN 5-325 MG PO TABS: 1.0000 | ORAL_TABLET | Freq: Four times a day (QID) | ORAL | 0 refills | Status: DC | PRN

## 2019-06-17 MED ORDER — CYCLOBENZAPRINE HCL 10 MG PO TABS: 10.0000 mg | ORAL_TABLET | Freq: Three times a day (TID) | ORAL | 0 refills | Status: DC | PRN
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
## 2019-10-20 ENCOUNTER — Other Ambulatory Visit: Payer: Self-pay

## 2019-10-20 ENCOUNTER — Emergency Department (HOSPITAL_BASED_OUTPATIENT_CLINIC_OR_DEPARTMENT_OTHER)
Admission: EM | Admit: 2019-10-20 | Discharge: 2019-10-21 | Disposition: A | Discharge status: Home / Self Care | Payer: Medicaid Other | Attending: Emergency Medicine | Admitting: Emergency Medicine

## 2019-10-20 ENCOUNTER — Encounter (HOSPITAL_BASED_OUTPATIENT_CLINIC_OR_DEPARTMENT_OTHER): Payer: Self-pay

## 2019-10-20 DIAGNOSIS — N939 Abnormal uterine and vaginal bleeding, unspecified: Secondary | ICD-10-CM | POA: Diagnosis present

## 2019-10-20 DIAGNOSIS — J45909 Unspecified asthma, uncomplicated: Secondary | ICD-10-CM | POA: Insufficient documentation

## 2019-10-20 DIAGNOSIS — Z79899 Other long term (current) drug therapy: Secondary | ICD-10-CM | POA: Insufficient documentation

## 2019-10-20 DIAGNOSIS — N760 Acute vaginitis: Secondary | ICD-10-CM | POA: Insufficient documentation

## 2019-10-20 DIAGNOSIS — B9689 Other specified bacterial agents as the cause of diseases classified elsewhere: Secondary | ICD-10-CM

## 2019-10-20 LAB — CBC WITH DIFFERENTIAL/PLATELET
Abs Immature Granulocytes: 0.03 10*3/uL (ref 0.00–0.07)
Basophils Absolute: 0 10*3/uL (ref 0.0–0.1)
Basophils Relative: 0 %
Eosinophils Absolute: 0 10*3/uL (ref 0.0–0.5)
Eosinophils Relative: 0 %
HCT: 38.9 % (ref 36.0–46.0)
Hemoglobin: 12.1 g/dL (ref 12.0–15.0)
Immature Granulocytes: 0 %
Lymphocytes Relative: 34 %
Lymphs Abs: 3.2 10*3/uL (ref 0.7–4.0)
MCH: 24.7 pg — ABNORMAL LOW (ref 26.0–34.0)
MCHC: 31.1 g/dL (ref 30.0–36.0)
MCV: 79.4 fL — ABNORMAL LOW (ref 80.0–100.0)
Monocytes Absolute: 0.5 10*3/uL (ref 0.1–1.0)
Monocytes Relative: 6 %
Neutro Abs: 5.6 10*3/uL (ref 1.7–7.7)
Neutrophils Relative %: 60 %
Platelets: 308 10*3/uL (ref 150–400)
RBC: 4.9 MIL/uL (ref 3.87–5.11)
RDW: 15.4 % (ref 11.5–15.5)
WBC: 9.4 10*3/uL (ref 4.0–10.5)
nRBC: 0 % (ref 0.0–0.2)

## 2019-10-20 NOTE — ED Triage Notes (Signed)
Pt c/o vaginal bleeding since January-states she was seen by GYN in March-started on BCP has cont'd to bleed-states she could not get another appt until June-NAD-steady gait

## 2019-10-21 ENCOUNTER — Encounter (HOSPITAL_BASED_OUTPATIENT_CLINIC_OR_DEPARTMENT_OTHER): Payer: Self-pay | Admitting: Emergency Medicine

## 2019-10-21 LAB — URINALYSIS, ROUTINE W REFLEX MICROSCOPIC
Bilirubin Urine: NEGATIVE
Glucose, UA: NEGATIVE mg/dL
Ketones, ur: 15 mg/dL — AB
Leukocytes,Ua: NEGATIVE
Nitrite: NEGATIVE
Protein, ur: NEGATIVE mg/dL
Specific Gravity, Urine: 1.025 (ref 1.005–1.030)
pH: 6.5 (ref 5.0–8.0)

## 2019-10-21 LAB — PREGNANCY, URINE: Preg Test, Ur: NEGATIVE

## 2019-10-21 LAB — WET PREP, GENITAL
Sperm: NONE SEEN
Trich, Wet Prep: NONE SEEN
Yeast Wet Prep HPF POC: NONE SEEN

## 2019-10-21 LAB — URINALYSIS, MICROSCOPIC (REFLEX)
RBC / HPF: >50 RBC/hpf (ref 0–5)
WBC, UA: NONE SEEN WBC/hpf (ref 0–5)

## 2019-10-21 MED ORDER — METRONIDAZOLE 500 MG PO TABS
500.0000 mg | ORAL_TABLET | ORAL | Status: AC
  Administered 2019-10-21: 500 mg via ORAL
  Filled 2019-10-21: qty 1

## 2019-10-21 MED ORDER — METRONIDAZOLE 500 MG PO TABS: 500.0000 mg | ORAL_TABLET | Freq: Two times a day (BID) | ORAL | 0 refills | Status: DC

## 2019-10-21 NOTE — ED Provider Notes (Signed)
[facility] EMERGENCY DEPARTMENT Provider Note   CSN: [id number] Arrival date & time: 10/20/19  2153     History Chief Complaint  Patient presents with  . Vaginal Bleeding    Tracey Copeland is a 29 y.o. female.  The history is provided by the patient.  Vaginal Bleeding Quality:  Dark red Severity:  Moderate Onset quality:  Gradual Timing:  Intermittent Progression:  Waxing and waning Chronicity:  Chronic Menstrual history:  Irregular Number of pads used:  5 Possible pregnancy: no   Context: spontaneously   Context: not after intercourse   Relieved by:  Nothing Worsened by:  Nothing Ineffective treatments:  None tried Associated symptoms: no abdominal pain, no back pain, no dizziness and no fever   Risk factors: no bleeding disorder        Past Medical History:  Diagnosis Date  . Anemia   . Asthma   . Bipolar 1 disorder ([place])   . Blood transfusion without reported diagnosis   . Schizophrenia [facility])     Patient Active Problem List   Diagnosis Date Noted  . IDA (iron deficiency anemia) 09/11/2018  . Symptomatic anemia 08/24/2018  . Menorrhagia 03/23/2018  . Anemia 03/23/2018    Past Surgical History:  Procedure Laterality Date  . DILATION AND CURETTAGE OF UTERUS       OB History   No obstetric history on file.     History reviewed. No pertinent family history.  Social History   Tobacco Use  . Smoking status: Never Smoker  . Smokeless tobacco: Never Used  Substance Use Topics  . Alcohol use: No  . Drug use: No    Home Medications Prior to Admission medications   Medication Sig Start Date End Date Taking? Authorizing Provider  albuterol (PROVENTIL HFA;VENTOLIN HFA) 108 (90 Base) MCG/ACT inhaler Inhale 2 puffs into the lungs every 6 (six) hours as needed for wheezing or shortness of breath.    [provider]  cyclobenzaprine (FLEXERIL) 10 MG tablet Take 1 tablet (10 mg total) by mouth 3 (three) times daily as needed  for muscle spasms. 06/17/19   Molpus, John, MD  ergocalciferol (VITAMIN D2) 1.25 MG (50000 UT) capsule Take 1 capsule (50,000 Units total) by mouth once a week. 09/23/18   [place], Holli Humbles, NP  folic acid (FOLVITE) 1 MG tablet Take 1 tablet (1 mg total) by mouth daily. 09/12/18   [place], Holli Humbles, NP  HYDROcodone-acetaminophen (NORCO) 5-325 MG tablet Take 1 tablet by mouth every 6 (six) hours as needed (for pain). 06/17/19   Molpus, John, MD  methocarbamol (ROBAXIN) 500 MG tablet Take 1 tablet (500 mg total) by mouth 2 (two) times daily. 09/03/18   Darlin Drop P, PA-C  metroNIDAZOLE (FLAGYL) 500 MG tablet Take 1 tablet (500 mg total) by mouth 2 (two) times daily. One po bid x 7 days 10/21/19   Jean Alejos, MD  Multiple Vitamins-Calcium (ONE-A-DAY WOMENS PO) Take 1 tablet by mouth daily.    [provider]  tranexamic acid (LYSTEDA) 650 MG TABS tablet Take 650 mg by mouth 2 (two) times daily.     [provider]    Allergies    Feraheme [ferumoxytol] and Onion  Review of Systems   Review of Systems  Constitutional: Negative for fever.  HENT: Negative for congestion.   Eyes: Negative for visual disturbance.  Respiratory: Negative for shortness of breath.   Cardiovascular: Negative for chest pain.  Gastrointestinal: Negative for abdominal pain.  Genitourinary: Positive for vaginal  bleeding.  Musculoskeletal: Negative for back pain.  Neurological: Negative for dizziness.  Psychiatric/Behavioral: Negative for agitation.  All other systems reviewed and are negative.   Physical Exam Updated Vital Signs BP 112/67 (BP Location: Right Wrist)   Pulse 93   Temp 98.3 F (36.8 C) (Oral)   Resp 19   Ht 5\' 1"  (1.549 m)   Wt 118.8 kg   LMP  (LMP Unknown)   SpO2 100%   BMI 49.50 kg/m   Physical Exam Vitals and nursing note reviewed.  Constitutional:      General: She is not in acute distress.    Appearance: Normal appearance.  HENT:     Head: Normocephalic and  atraumatic.     Nose: Nose normal.  Eyes:     Conjunctiva/sclera: Conjunctivae normal.     Pupils: Pupils are equal, round, and reactive to light.  Cardiovascular:     Rate and Rhythm: Normal rate and regular rhythm.     Pulses: Normal pulses.     Heart sounds: Normal heart sounds.  Pulmonary:     Effort: Pulmonary effort is normal.     Breath sounds: Normal breath sounds.  Abdominal:     General: Abdomen is flat. Bowel sounds are normal.     Tenderness: There is no abdominal tenderness. There is no guarding or rebound.  Genitourinary:    Comments: Chaperone present scant bleeding no CMT Musculoskeletal:        General: Normal range of motion.     Cervical back: Normal range of motion and neck supple.  Skin:    General: Skin is warm and dry.     Capillary Refill: Capillary refill takes less than 2 seconds.  Neurological:     General: No focal deficit present.     Mental Status: She is alert and oriented to person, place, and time.     Deep Tendon Reflexes: Reflexes normal.  Psychiatric:        Mood and Affect: Mood normal.        Behavior: Behavior normal.     ED Results / Procedures / Treatments   Labs (all labs ordered are listed, but only abnormal results are displayed) Results for orders placed or performed during the hospital encounter of 10/20/19  Wet prep, genital  Result Value Ref Range   Yeast Wet Prep HPF POC NONE SEEN NONE SEEN   Trich, Wet Prep NONE SEEN NONE SEEN   Clue Cells Wet Prep HPF POC PRESENT (A) NONE SEEN   WBC, Wet Prep HPF POC MODERATE (A) NONE SEEN   Sperm NONE SEEN   Pregnancy, urine  Result Value Ref Range   Preg Test, Ur NEGATIVE NEGATIVE  CBC with Differential  Result Value Ref Range   WBC 9.4 4.0 - 10.5 K/uL   RBC 4.90 3.87 - 5.11 MIL/uL   Hemoglobin 12.1 12.0 - 15.0 g/dL   HCT 38.9 36.0 - 46.0 %   MCV 79.4 (L) 80.0 - 100.0 fL   MCH 24.7 (L) 26.0 - 34.0 pg   MCHC 31.1 30.0 - 36.0 g/dL   RDW 15.4 11.5 - 15.5 %   Platelets 308 150 -  400 K/uL   nRBC 0.0 0.0 - 0.2 %   Neutrophils Relative % 60 %   Neutro Abs 5.6 1.7 - 7.7 K/uL   Lymphocytes Relative 34 %   Lymphs Abs 3.2 0.7 - 4.0 K/uL   Monocytes Relative 6 %   Monocytes Absolute 0.5 0.1 - 1.0 K/uL  Eosinophils Relative 0 %   Eosinophils Absolute 0.0 0.0 - 0.5 K/uL   Basophils Relative 0 %   Basophils Absolute 0.0 0.0 - 0.1 K/uL   Immature Granulocytes 0 %   Abs Immature Granulocytes 0.03 0.00 - 0.07 K/uL  Urinalysis, Routine w reflex microscopic  Result Value Ref Range   Color, Urine YELLOW YELLOW   APPearance HAZY (A) CLEAR   Specific Gravity, Urine 1.025 1.005 - 1.030   pH 6.5 5.0 - 8.0   Glucose, UA NEGATIVE NEGATIVE mg/dL   Hgb urine dipstick LARGE (A) NEGATIVE   Bilirubin Urine NEGATIVE NEGATIVE   Ketones, ur 15 (A) NEGATIVE mg/dL   Protein, ur NEGATIVE NEGATIVE mg/dL   Nitrite NEGATIVE NEGATIVE   Leukocytes,Ua NEGATIVE NEGATIVE  Urinalysis, Microscopic (reflex)  Result Value Ref Range   RBC / HPF >50 0 - 5 RBC/hpf   WBC, UA NONE SEEN 0 - 5 WBC/hpf   Bacteria, UA RARE (A) NONE SEEN   Squamous Epithelial / LPF 0-5 0 - 5   No results found.  EKG None  Radiology No results found.  Procedures Procedures (including critical care time)  Medications Ordered in ED Medications  metroNIDAZOLE (FLAGYL) tablet 500 mg (500 mg Oral Given 10/21/19 0139)    ED Course  I have reviewed the triage vital signs and the nursing notes.  Pertinent labs & imaging results that were available during my care of the patient were reviewed by me and considered in my medical decision making (see chart for details).    MDM Rules/Calculators/A&P                      Bleeding is only scant, on OCP.  Has follow up with GYN. Not anemic.  Treated for BV  Tracey Copeland was evaluated in Emergency Department on 10/21/2019 for the symptoms described in the history of present illness. She was evaluated in the context of the global COVID-19 pandemic, which  necessitated consideration that the patient might be at risk for infection with the SARS-CoV-2 virus that causes COVID-19. Institutional protocols and algorithms that pertain to the evaluation of patients at risk for COVID-19 are in a state of rapid change based on information released by regulatory bodies including the CDC and federal and state organizations. These policies and algorithms were followed during the patient's care in the ED. Final Clinical Impression(s) / ED Diagnoses Final diagnoses:  Vaginal bleeding  BV (bacterial vaginosis)   Return for weakness, numbness, changes in vision or speech, fevers >100.4 unrelieved by medication, shortness of breath, intractable vomiting, or diarrhea, abdominal pain, Inability to tolerate liquids or food, cough, altered mental status or any concerns. No signs of systemic illness or infection. The patient is nontoxic-appearing on exam and vital signs are within normal limits.   I have reviewed the triage vital signs and the nursing notes. Pertinent labs &imaging results that were available during my care of the patient were reviewed by me and considered in my medical decision making (see chart for details).  After history, exam, and medical workup I feel the patient has been appropriately medically screened and is safe for discharge home. Pertinent diagnoses were discussed with the patient. Patient was givenstrictreturn precautions.    Steadman Prosperi, MD 10/21/19 [phone]

## 2019-10-22 LAB — GC/CHLAMYDIA PROBE AMP (~~LOC~~) NOT AT ARMC
Chlamydia: NEGATIVE
Comment: NEGATIVE
Comment: NORMAL
Neisseria Gonorrhea: NEGATIVE

## 2020-02-03 ENCOUNTER — Other Ambulatory Visit: Payer: Self-pay | Admitting: Family

## 2020-02-03 DIAGNOSIS — D500 Iron deficiency anemia secondary to blood loss (chronic): Secondary | ICD-10-CM

## 2020-02-03 DIAGNOSIS — N921 Excessive and frequent menstruation with irregular cycle: Secondary | ICD-10-CM

## 2020-02-04 ENCOUNTER — Ambulatory Visit: Payer: Medicaid Other | Admitting: Family

## 2020-02-04 ENCOUNTER — Other Ambulatory Visit: Payer: Medicaid Other

## 2020-02-05 ENCOUNTER — Other Ambulatory Visit: Payer: Self-pay

## 2020-02-05 ENCOUNTER — Encounter (HOSPITAL_BASED_OUTPATIENT_CLINIC_OR_DEPARTMENT_OTHER): Payer: Self-pay

## 2020-02-05 DIAGNOSIS — N921 Excessive and frequent menstruation with irregular cycle: Secondary | ICD-10-CM | POA: Diagnosis not present

## 2020-02-05 DIAGNOSIS — Z79899 Other long term (current) drug therapy: Secondary | ICD-10-CM | POA: Insufficient documentation

## 2020-02-05 DIAGNOSIS — J45909 Unspecified asthma, uncomplicated: Secondary | ICD-10-CM | POA: Insufficient documentation

## 2020-02-05 DIAGNOSIS — N939 Abnormal uterine and vaginal bleeding, unspecified: Secondary | ICD-10-CM | POA: Diagnosis present

## 2020-02-05 LAB — CBC WITH DIFFERENTIAL/PLATELET
Abs Immature Granulocytes: 0.03 10*3/uL (ref 0.00–0.07)
Basophils Absolute: 0 10*3/uL (ref 0.0–0.1)
Basophils Relative: 0 %
Eosinophils Absolute: 0 10*3/uL (ref 0.0–0.5)
Eosinophils Relative: 0 %
HCT: 33.1 % — ABNORMAL LOW (ref 36.0–46.0)
Hemoglobin: 9.9 g/dL — ABNORMAL LOW (ref 12.0–15.0)
Immature Granulocytes: 0 %
Lymphocytes Relative: 26 %
Lymphs Abs: 2.6 10*3/uL (ref 0.7–4.0)
MCH: 21.1 pg — ABNORMAL LOW (ref 26.0–34.0)
MCHC: 29.9 g/dL — ABNORMAL LOW (ref 30.0–36.0)
MCV: 70.6 fL — ABNORMAL LOW (ref 80.0–100.0)
Monocytes Absolute: 0.4 10*3/uL (ref 0.1–1.0)
Monocytes Relative: 4 %
Neutro Abs: 6.8 10*3/uL (ref 1.7–7.7)
Neutrophils Relative %: 70 %
Platelets: 454 10*3/uL — ABNORMAL HIGH (ref 150–400)
RBC: 4.69 MIL/uL (ref 3.87–5.11)
RDW: 16.3 % — ABNORMAL HIGH (ref 11.5–15.5)
WBC: 9.8 10*3/uL (ref 4.0–10.5)
nRBC: 0 % (ref 0.0–0.2)

## 2020-02-05 LAB — PREGNANCY, URINE: Preg Test, Ur: NEGATIVE

## 2020-02-05 NOTE — ED Triage Notes (Addendum)
Pt c/o vaginal bleeding since Feb-states she is concerned her hgb is low-pt states she is being followed by GYN with multiple BCP changes for tx-awaiting referral to hem/onc- NAD-steady gait

## 2020-02-06 ENCOUNTER — Encounter (HOSPITAL_BASED_OUTPATIENT_CLINIC_OR_DEPARTMENT_OTHER): Payer: Self-pay | Admitting: Emergency Medicine

## 2020-02-06 ENCOUNTER — Emergency Department (HOSPITAL_BASED_OUTPATIENT_CLINIC_OR_DEPARTMENT_OTHER)
Admission: EM | Admit: 2020-02-06 | Discharge: 2020-02-06 | Disposition: A | Discharge status: Home / Self Care | Payer: Medicaid Other | Attending: Emergency Medicine | Admitting: Emergency Medicine

## 2020-02-06 DIAGNOSIS — N921 Excessive and frequent menstruation with irregular cycle: Secondary | ICD-10-CM

## 2020-02-06 LAB — IRON AND TIBC
Iron: 15 ug/dL — ABNORMAL LOW (ref 28–170)
Saturation Ratios: 4 % — ABNORMAL LOW (ref 10.4–31.8)
TIBC: 409 ug/dL (ref 250–450)
UIBC: 394 ug/dL

## 2020-02-06 LAB — FERRITIN: Ferritin: 9 ng/mL — ABNORMAL LOW (ref 11–307)

## 2020-02-06 LAB — RETICULOCYTES
Immature Retic Fract: 29.2 % — ABNORMAL HIGH (ref 2.3–15.9)
RBC.: 4.57 MIL/uL (ref 3.87–5.11)
Retic Count, Absolute: 80.8 10*3/uL (ref 19.0–186.0)
Retic Ct Pct: 1.8 % (ref 0.4–3.1)

## 2020-02-06 MED ORDER — FERROUS SULFATE 325 (65 FE) MG PO TABS: 325.0000 mg | ORAL_TABLET | Freq: Two times a day (BID) | ORAL | 2 refills | Status: AC

## 2020-02-06 NOTE — ED Provider Notes (Signed)
[facility] EMERGENCY DEPARTMENT Provider Note   CSN: [id number] Arrival date & time: 02/05/20  1931     History Chief Complaint  Patient presents with  . Vaginal Bleeding    Tracey Copeland is a 29 y.o. female.  Patient presents with concerns over possible anemia secondary to abnormal uterine bleeding.  Patient reports that the abnormal uterine bleeding has been ongoing for many months.  She is under the care of OB/GYN for this.  She has been started on oral birth control pills to help correct this but still has a lot of breakthrough bleeding.  She had some cramping and heavy bleeding earlier today, wanted her hemoglobin checked.  She did not have any dizziness, heart palpitations, shortness of breath, weakness or passing out.        Past Medical History:  Diagnosis Date  . Anemia   . Asthma   . Bipolar 1 disorder ([place])   . Blood transfusion without reported diagnosis   . Schizophrenia [facility])     Patient Active Problem List   Diagnosis Date Noted  . IDA (iron deficiency anemia) 09/11/2018  . Symptomatic anemia 08/24/2018  . Menorrhagia 03/23/2018  . Anemia 03/23/2018    Past Surgical History:  Procedure Laterality Date  . DILATION AND CURETTAGE OF UTERUS       OB History   No obstetric history on file.     No family history on file.  Social History   Tobacco Use  . Smoking status: Never Smoker  . Smokeless tobacco: Never Used  Vaping Use  . Vaping Use: Never used  Substance Use Topics  . Alcohol use: No  . Drug use: No    Home Medications Prior to Admission medications   Medication Sig Start Date End Date Taking? Authorizing Provider  norethindrone-ethinyl estradiol (CYCLAFEM) 0.5/0.75/1-35 MG-MCG tablet Take 1 tablet by mouth daily.   Yes [provider]  albuterol (PROVENTIL HFA;VENTOLIN HFA) 108 (90 Base) MCG/ACT inhaler Inhale 2 puffs into the lungs every 6 (six) hours as needed for wheezing or shortness of breath.     [provider]  cyclobenzaprine (FLEXERIL) 10 MG tablet Take 1 tablet (10 mg total) by mouth 3 (three) times daily as needed for muscle spasms. 06/17/19   Molpus, John, MD  ergocalciferol (VITAMIN D2) 1.25 MG (50000 UT) capsule Take 1 capsule (50,000 Units total) by mouth once a week. 09/23/18   [place], Holli Humbles, NP  ferrous sulfate 325 (65 FE) MG tablet Take 1 tablet (325 mg total) by mouth 2 (two) times daily with a meal. 02/06/20   Eman Rynders, Gwenyth Allegra, MD  folic acid (FOLVITE) 1 MG tablet Take 1 tablet (1 mg total) by mouth daily. 09/12/18   [place], Holli Humbles, NP  HYDROcodone-acetaminophen (NORCO) 5-325 MG tablet Take 1 tablet by mouth every 6 (six) hours as needed (for pain). 06/17/19   Molpus, John, MD  methocarbamol (ROBAXIN) 500 MG tablet Take 1 tablet (500 mg total) by mouth 2 (two) times daily. 09/03/18   Darlin Drop P, PA-C  metroNIDAZOLE (FLAGYL) 500 MG tablet Take 1 tablet (500 mg total) by mouth 2 (two) times daily. One po bid x 7 days 10/21/19   [place], April, MD  Multiple Vitamins-Calcium (ONE-A-DAY WOMENS PO) Take 1 tablet by mouth daily.    [provider]  tranexamic acid (LYSTEDA) 650 MG TABS tablet Take 650 mg by mouth 2 (two) times daily.     [provider]    Allergies  Feraheme [ferumoxytol] and Onion  Review of Systems   Review of Systems  Genitourinary: Positive for vaginal bleeding.  All other systems reviewed and are negative.   Physical Exam Updated Vital Signs BP (!) 99/60 (BP Location: Right Arm)   Pulse 100   Temp 98.7 F (37.1 C) (Oral)   Resp 20   Ht 5\' 1"  (1.549 m)   Wt (!) 116.6 kg   LMP 08/10/2019 (Approximate)   SpO2 100%   BMI 48.56 kg/m   Physical Exam Vitals and nursing note reviewed.  Constitutional:      General: She is not in acute distress.    Appearance: Normal appearance. She is well-developed.  HENT:     Head: Normocephalic and atraumatic.     Right Ear: Hearing normal.     Left Ear: Hearing  normal.     Nose: Nose normal.  Eyes:     Conjunctiva/sclera: Conjunctivae normal.     Pupils: Pupils are equal, round, and reactive to light.  Cardiovascular:     Rate and Rhythm: Regular rhythm.     Heart sounds: S1 normal and S2 normal. No murmur heard.  No friction rub. No gallop.   Pulmonary:     Effort: Pulmonary effort is normal. No respiratory distress.     Breath sounds: Normal breath sounds.  Chest:     Chest wall: No tenderness.  Abdominal:     General: Bowel sounds are normal.     Palpations: Abdomen is soft.     Tenderness: There is no abdominal tenderness. There is no guarding or rebound. Negative signs include Murphy's sign and McBurney's sign.     Hernia: No hernia is present.  Musculoskeletal:        General: Normal range of motion.     Cervical back: Normal range of motion and neck supple.  Skin:    General: Skin is warm and dry.     Findings: No rash.  Neurological:     Mental Status: She is alert and oriented to person, place, and time.     GCS: GCS eye subscore is 4. GCS verbal subscore is 5. GCS motor subscore is 6.     Cranial Nerves: No cranial nerve deficit.     Sensory: No sensory deficit.     Coordination: Coordination normal.  Psychiatric:        Speech: Speech normal.        Behavior: Behavior normal.        Thought Content: Thought content normal.     ED Results / Procedures / Treatments   Labs (all labs ordered are listed, but only abnormal results are displayed) Labs Reviewed  CBC WITH DIFFERENTIAL/PLATELET - Abnormal; Notable for the following components:      Result Value   Hemoglobin 9.9 (*)    HCT 33.1 (*)    MCV 70.6 (*)    MCH 21.1 (*)    MCHC 29.9 (*)    RDW 16.3 (*)    Platelets 454 (*)    All other components within normal limits  PREGNANCY, URINE  RETICULOCYTES  FERRITIN  IRON AND TIBC    EKG None  Radiology No results found.  Procedures Procedures (including critical care time)  Medications Ordered in  ED Medications - No data to display  ED Course  I have reviewed the triage vital signs and the nursing notes.  Pertinent labs & imaging results that were available during my care of the patient were reviewed by me and  considered in my medical decision making (see chart for details).    MDM Rules/Calculators/A&P                          Patient appears well.  Hemoglobin is 9.9.  Discussed with patient that this is a reassuring number, does not require any transfusion at this time.  Reviewing her records reveals that she has been referred to oncology and they have ordered iron studies that have not been drawn.  These will be drawn tonight and then patient can be started on oral iron as an outpatient until she can follow-up.  She was given return precautions.  Final Clinical Impression(s) / ED Diagnoses Final diagnoses:  Menorrhagia with irregular cycle    Rx / DC Orders ED Discharge Orders         Ordered    ferrous sulfate 325 (65 FE) MG tablet  2 times daily with meals     Discontinue  Reprint     02/06/20 0142           Orpah Greek, MD 02/06/20 [phone]

## 2020-02-10 ENCOUNTER — Telehealth: Payer: Self-pay | Admitting: Family

## 2020-02-12 ENCOUNTER — Inpatient Hospital Stay: Payer: Medicaid Other

## 2020-02-12 ENCOUNTER — Other Ambulatory Visit: Payer: Self-pay | Admitting: Family

## 2020-02-12 ENCOUNTER — Inpatient Hospital Stay: Payer: Medicaid Other | Attending: Family | Admitting: Family

## 2020-02-12 DIAGNOSIS — D500 Iron deficiency anemia secondary to blood loss (chronic): Secondary | ICD-10-CM | POA: Insufficient documentation

## 2020-02-12 DIAGNOSIS — N920 Excessive and frequent menstruation with regular cycle: Secondary | ICD-10-CM | POA: Insufficient documentation

## 2020-02-29 ENCOUNTER — Inpatient Hospital Stay: Payer: Medicaid Other

## 2020-02-29 ENCOUNTER — Telehealth: Payer: Self-pay | Admitting: Family

## 2020-02-29 ENCOUNTER — Inpatient Hospital Stay (HOSPITAL_BASED_OUTPATIENT_CLINIC_OR_DEPARTMENT_OTHER): Payer: Medicaid Other | Admitting: Family

## 2020-02-29 ENCOUNTER — Encounter: Payer: Self-pay | Admitting: Family

## 2020-02-29 VITALS — BP 96/48 | HR 83 | Temp 98.9°F | Resp 18 | Wt 255.8 lb

## 2020-02-29 VITALS — BP 105/65 | HR 82 | Resp 18

## 2020-02-29 DIAGNOSIS — D500 Iron deficiency anemia secondary to blood loss (chronic): Secondary | ICD-10-CM | POA: Diagnosis not present

## 2020-02-29 DIAGNOSIS — N920 Excessive and frequent menstruation with regular cycle: Secondary | ICD-10-CM | POA: Diagnosis present

## 2020-02-29 DIAGNOSIS — N921 Excessive and frequent menstruation with irregular cycle: Secondary | ICD-10-CM

## 2020-02-29 DIAGNOSIS — D649 Anemia, unspecified: Secondary | ICD-10-CM

## 2020-02-29 LAB — IRON AND TIBC
Iron: 9 ug/dL — ABNORMAL LOW (ref 41–142)
Saturation Ratios: 2 % — ABNORMAL LOW (ref 21–57)
TIBC: 403 ug/dL (ref 236–444)
UIBC: 393 ug/dL — ABNORMAL HIGH (ref 120–384)

## 2020-02-29 LAB — CBC WITH DIFFERENTIAL (CANCER CENTER ONLY)
Abs Immature Granulocytes: 0.08 10*3/uL — ABNORMAL HIGH (ref 0.00–0.07)
Basophils Absolute: 0 10*3/uL (ref 0.0–0.1)
Basophils Relative: 1 %
Eosinophils Absolute: 0 10*3/uL (ref 0.0–0.5)
Eosinophils Relative: 0 %
HCT: 26 % — ABNORMAL LOW (ref 36.0–46.0)
Hemoglobin: 7.6 g/dL — ABNORMAL LOW (ref 12.0–15.0)
Immature Granulocytes: 1 %
Lymphocytes Relative: 23 %
Lymphs Abs: 1.5 10*3/uL (ref 0.7–4.0)
MCH: 19.1 pg — ABNORMAL LOW (ref 26.0–34.0)
MCHC: 29.2 g/dL — ABNORMAL LOW (ref 30.0–36.0)
MCV: 65.5 fL — ABNORMAL LOW (ref 80.0–100.0)
Monocytes Absolute: 0.5 10*3/uL (ref 0.1–1.0)
Monocytes Relative: 8 %
Neutro Abs: 4.5 10*3/uL (ref 1.7–7.7)
Neutrophils Relative %: 67 %
Platelet Count: 374 10*3/uL (ref 150–400)
RBC: 3.97 MIL/uL (ref 3.87–5.11)
RDW: 17 % — ABNORMAL HIGH (ref 11.5–15.5)
WBC Count: 6.7 10*3/uL (ref 4.0–10.5)
nRBC: 0 % (ref 0.0–0.2)

## 2020-02-29 LAB — RETICULOCYTES
Immature Retic Fract: 31.2 % — ABNORMAL HIGH (ref 2.3–15.9)
RBC.: 3.96 MIL/uL (ref 3.87–5.11)
Retic Count, Absolute: 83.2 10*3/uL (ref 19.0–186.0)
Retic Ct Pct: 2.1 % (ref 0.4–3.1)

## 2020-02-29 LAB — FERRITIN: Ferritin: 5 ng/mL — ABNORMAL LOW (ref 11–307)

## 2020-02-29 MED ORDER — FAMOTIDINE IN NACL 20-0.9 MG/50ML-% IV SOLN
20.0000 mg | Freq: Once | INTRAVENOUS | Status: AC
  Administered 2020-02-29: 20 mg via INTRAVENOUS

## 2020-02-29 MED ORDER — SODIUM CHLORIDE 0.9 % IV SOLN
Freq: Once | INTRAVENOUS | Status: AC
  Filled 2020-02-29: qty 250

## 2020-02-29 MED ORDER — FOLIC ACID 1 MG PO TABS: 1.0000 mg | ORAL_TABLET | Freq: Every day | ORAL | 11 refills | Status: DC

## 2020-02-29 MED ORDER — METHYLPREDNISOLONE SODIUM SUCC 125 MG IJ SOLR
80.0000 mg | Freq: Once | INTRAMUSCULAR | Status: AC
  Administered 2020-02-29: 80 mg via INTRAVENOUS

## 2020-02-29 MED ORDER — SODIUM CHLORIDE 0.9 % IV SOLN
200.0000 mg | Freq: Once | INTRAVENOUS | Status: AC
  Administered 2020-02-29: 200 mg via INTRAVENOUS
  Filled 2020-02-29: qty 200

## 2020-02-29 MED ORDER — METHYLPREDNISOLONE SODIUM SUCC 125 MG IJ SOLR
INTRAMUSCULAR | Status: AC
  Filled 2020-02-29: qty 2

## 2020-02-29 MED ORDER — FAMOTIDINE IN NACL 20-0.9 MG/50ML-% IV SOLN
INTRAVENOUS | Status: AC
  Filled 2020-02-29: qty 50

## 2020-02-29 NOTE — Patient Instructions (Signed)
Ferric carboxymaltose injection What is this medicine? FERRIC CARBOXYMALTOSE (ferr-ik car-box-ee-mol-toes) is an iron complex. Iron is used to make healthy red blood cells, which carry oxygen and nutrients throughout the body. This medicine is used to treat anemia in people with chronic kidney disease or people who cannot take iron by mouth. This medicine may be used for other purposes; ask your health care provider or pharmacist if you have questions. COMMON BRAND NAME(S): Injectafer What should I tell my health care provider before I take this medicine? They need to know if you have any of these conditions: -high levels of iron in the blood -liver disease -an unusual or allergic reaction to iron, other medicines, foods, dyes, or preservatives -pregnant or trying to get pregnant -breast-feeding How should I use this medicine? This medicine is for infusion into a vein. It is given by a health care professional in a hospital or clinic setting. Talk to your pediatrician regarding the use of this medicine in children. Special care may be needed. Overdosage: If you think you have taken too much of this medicine contact a poison control center or emergency room at once. NOTE: This medicine is only for you. Do not share this medicine with others. What if I miss a dose? It is important not to miss your dose. Call your doctor or health care professional if you are unable to keep an appointment. What may interact with this medicine? Do not take this medicine with any of the following medications: -deferoxamine -dimercaprol -other iron products This list may not describe all possible interactions. Give your health care provider a list of all the medicines, herbs, non-prescription drugs, or dietary supplements you use. Also tell them if you smoke, drink alcohol, or use illegal drugs. Some items may interact with your medicine. What should I watch for while using this medicine? Visit your doctor or  health care professional regularly. Tell your doctor if your symptoms do not start to get better or if they get worse. You may need blood work done while you are taking this medicine. You may need to follow a special diet. Talk to your doctor. Foods that contain iron include: whole grains/cereals, dried fruits, beans, or peas, leafy green vegetables, and organ meats (liver, kidney). What side effects may I notice from receiving this medicine? Side effects that you should report to your doctor or health care professional as soon as possible: -allergic reactions like skin rash, itching or hives, swelling of the face, lips, or tongue -dizziness -facial flushing Side effects that usually do not require medical attention (report to your doctor or health care professional if they continue or are bothersome): -changes in taste -constipation -headache -nausea, vomiting -pain, redness, or irritation at site where injected This list may not describe all possible side effects. Call your doctor for medical advice about side effects. You may report side effects to FDA at 1-800-FDA-1088. Where should I keep my medicine? This drug is given in a hospital or clinic and will not be stored at home. NOTE: This sheet is a summary. It may not cover all possible information. If you have questions about this medicine, talk to your doctor, pharmacist, or health care provider.  2019 Elsevier/Gold Standard (2016-08-09 09:40:29) Ferric carboxymaltose injection What is this medicine? FERRIC CARBOXYMALTOSE (ferr-ik car-box-ee-mol-toes) is an iron complex. Iron is used to make healthy red blood cells, which carry oxygen and nutrients throughout the body. This medicine is used to treat anemia in people with chronic kidney disease or people  who cannot take iron by mouth. This medicine may be used for other purposes; ask your health care provider or pharmacist if you have questions. COMMON BRAND NAME(S): Injectafer What  should I tell my health care provider before I take this medicine? They need to know if you have any of these conditions: -high levels of iron in the blood -liver disease -an unusual or allergic reaction to iron, other medicines, foods, dyes, or preservatives -pregnant or trying to get pregnant -breast-feeding How should I use this medicine? This medicine is for infusion into a vein. It is given by a health care professional in a hospital or clinic setting. Talk to your pediatrician regarding the use of this medicine in children. Special care may be needed. Overdosage: If you think you have taken too much of this medicine contact a poison control center or emergency room at once. NOTE: This medicine is only for you. Do not share this medicine with others. What if I miss a dose? It is important not to miss your dose. Call your doctor or health care professional if you are unable to keep an appointment. What may interact with this medicine? Do not take this medicine with any of the following medications: -deferoxamine -dimercaprol -other iron products This list may not describe all possible interactions. Give your health care provider a list of all the medicines, herbs, non-prescription drugs, or dietary supplements you use. Also tell them if you smoke, drink alcohol, or use illegal drugs. Some items may interact with your medicine. What should I watch for while using this medicine? Visit your doctor or health care professional regularly. Tell your doctor if your symptoms do not start to get better or if they get worse. You may need blood work done while you are taking this medicine. You may need to follow a special diet. Talk to your doctor. Foods that contain iron include: whole grains/cereals, dried fruits, beans, or peas, leafy green vegetables, and organ meats (liver, kidney). What side effects may I notice from receiving this medicine? Side effects that you should report to your doctor or  health care professional as soon as possible: -allergic reactions like skin rash, itching or hives, swelling of the face, lips, or tongue -dizziness -facial flushing Side effects that usually do not require medical attention (report to your doctor or health care professional if they continue or are bothersome): -changes in taste -constipation -headache -nausea, vomiting -pain, redness, or irritation at site where injected This list may not describe all possible side effects. Call your doctor for medical advice about side effects. You may report side effects to FDA at 1-800-FDA-1088. Where should I keep my medicine? This drug is given in a hospital or clinic and will not be stored at home. NOTE: This sheet is a summary. It may not cover all possible information. If you have questions about this medicine, talk to your doctor, pharmacist, or health care provider.  2019 Elsevier/Gold Standard (2016-08-09 09:40:29)

## 2020-02-29 NOTE — Progress Notes (Signed)
Hematology and Oncology Follow Up Visit  Tracey Copeland [id number] February 11, 1991 29 y.o. 02/29/2020   Principle Diagnosis:  Iron deficiency anemia secondary to heavy cycle Alpha thalassemia   Current Therapy:        IV iron as indicated  Folic acid 1 mg PO daily   Interim History:  Tracey Copeland is here today for follow-up. She fell this morning in her bathroom at 4 am due to dizziness. She went to the ED and thankfully was not seriously injured. She denies losing consciousness.  Hgb at this time is 7.6, MCV 65.  She is currently on her cycle and states that her cycles are regular but still quite heavy despite being on birth control (CYCLAFEM). She has clots and cramping.  She plans to follow-up with her gynecologist. No other blood loss noted. No bruising or petechiae.  She states that she is able to tolerate IV iron as long as she is premedicated with solumedrol and Pepcid prior to. These are in her supportive plan to be given prior to her iron infusion.  She is symptomatic with fatigue, weakness, dizziness and SOB with exertion.  No fever, chills, n/v, cough, rash, chest pain, palpitations, abdominal pain or changes in bowel or bladder habits.  No swelling, tenderness, numbness or tingling in her extremities at this time.  She has maintained a good appetite and is staying hydrated. Her weight is stable.   ECOG Performance Status: 1 - Symptomatic but completely ambulatory  Medications:  Allergies as of 02/29/2020      Reactions   Feraheme [ferumoxytol] Shortness Of Breath   Iron Anaphylaxis   Intravenous iron only   Onion Itching, Swelling, Rash      Medication List       Accurate as of February 29, 2020 10:16 AM. If you have any questions, ask your nurse or doctor.        STOP taking these medications   HYDROcodone-acetaminophen 5-325 MG tablet Commonly known as: Norco Stopped by: Laverna Peace, NP   metroNIDAZOLE 500 MG tablet Commonly known as:  Flagyl Stopped by: Laverna Peace, NP     TAKE these medications   albuterol 108 (90 Base) MCG/ACT inhaler Commonly known as: VENTOLIN HFA Inhale 2 puffs into the lungs every 6 (six) hours as needed for wheezing or shortness of breath.   cyclobenzaprine 10 MG tablet Commonly known as: FLEXERIL Take 1 tablet (10 mg total) by mouth 3 (three) times daily as needed for muscle spasms.   EPINEPHrine 0.3 mg/0.3 mL Soaj injection Commonly known as: EPI-PEN Inject into the muscle.   ergocalciferol 1.25 MG (50000 UT) capsule Commonly known as: VITAMIN D2 Take 1 capsule (50,000 Units total) by mouth once a week.   ferrous sulfate 325 (65 FE) MG tablet Take 1 tablet (325 mg total) by mouth 2 (two) times daily with a meal.   folic acid 1 MG tablet Commonly known as: FOLVITE Take 1 tablet (1 mg total) by mouth daily.   methocarbamol 500 MG tablet Commonly known as: ROBAXIN Take 1 tablet (500 mg total) by mouth 2 (two) times daily.   naproxen 500 MG tablet Commonly known as: NAPROSYN Take by mouth.   norethindrone-ethinyl estradiol 0.5/0.75/1-35 MG-MCG tablet Commonly known as: CYCLAFEM Take 1 tablet by mouth daily.   norgestimate-ethinyl estradiol 0.25-35 MG-MCG tablet Commonly known as: ORTHO-CYCLEN Take 1 tablet by mouth daily.   ONE-A-DAY WOMENS PO Take 1 tablet by mouth daily.   tranexamic acid 650 MG Tabs tablet Commonly known as:  LYSTEDA Take 650 mg by mouth 2 (two) times daily.       Allergies:  Allergies  Allergen Reactions  . Feraheme [Ferumoxytol] Shortness Of Breath  . Iron Anaphylaxis    Intravenous iron only  . Onion Itching, Swelling and Rash    Past Medical History, Surgical history, Social history, and Family History were reviewed and updated.  Review of Systems: All other 10 point review of systems is negative.   Physical Exam:  weight is 255 lb 12.8 oz (116 kg). Her oral temperature is 98.9 F (37.2 C). Her blood pressure is 96/48 (abnormal)  and her pulse is 83. Her respiration is 18 and oxygen saturation is 100%.   Wt Readings from Last 3 Encounters:  02/29/20 255 lb 12.8 oz (116 kg)  02/05/20 (!) 257 lb (116.6 kg)  10/20/19 262 lb (118.8 kg)    Ocular: Sclerae unicteric, pupils equal, round and reactive to light Ear-nose-throat: Oropharynx clear, dentition fair Lymphatic: No cervical or supraclavicular adenopathy Lungs no rales or rhonchi, good excursion bilaterally Heart regular rate and rhythm, no murmur appreciated Abd soft, nontender, positive bowel sounds, no liver or spleen tip palpated on exam, no fluid wave  MSK no focal spinal tenderness, no joint edema Neuro: non-focal, well-oriented, appropriate affect Breasts: Deferred   Lab Results  Component Value Date   WBC 6.7 02/29/2020   HGB 7.6 (L) 02/29/2020   HCT 26.0 (L) 02/29/2020   MCV 65.5 (L) 02/29/2020   PLT 374 02/29/2020   Lab Results  Component Value Date   FERRITIN 9 (L) 02/06/2020   IRON 15 (L) 02/06/2020   TIBC 409 02/06/2020   UIBC 394 02/06/2020   IRONPCTSAT 4 (L) 02/06/2020   Lab Results  Component Value Date   RETICCTPCT 2.1 02/29/2020   RBC 3.97 02/29/2020   RBC 3.96 02/29/2020   No results found for: KPAFRELGTCHN, LAMBDASER, KAPLAMBRATIO No results found for: IGGSERUM, IGA, IGMSERUM No results found for: Odetta Pink, SPEI   Chemistry      Component Value Date/Time   NA 137 05/23/2019 0450   K 3.7 05/23/2019 0450   CL 105 05/23/2019 0450   CO2 25 05/23/2019 0450   BUN 16 05/23/2019 0450   CREATININE 0.92 05/23/2019 0450   CREATININE 0.77 09/12/2018 1300      Component Value Date/Time   CALCIUM 8.6 (L) 05/23/2019 0450   ALKPHOS 102 05/23/2019 0450   AST 13 (L) 05/23/2019 0450   AST 11 (L) 09/12/2018 1300   ALT 16 05/23/2019 0450   ALT 13 09/12/2018 1300   BILITOT 0.4 05/23/2019 0450   BILITOT 0.2 (L) 09/12/2018 1300       Impression and Plan: Tracey Copeland is  a very pleasant 29 yo African American female with iron deficiency anemia secondary to heavy cycles.  We will give her IV iron today followed by 4 more doses over the next 2 weeks.  We will plan to see her again in another 6 weeks.  She will continue her folic acid daily.  She can contact our office with any questions or concerns. We can certainly see her sooner if needed.   Laverna Peace, NP 8/23/202110:16 AM

## 2020-02-29 NOTE — Telephone Encounter (Signed)
Appointments scheduled calendar printed & mailed per 8/23 los

## 2020-03-07 ENCOUNTER — Inpatient Hospital Stay: Payer: Medicaid Other

## 2020-03-08 ENCOUNTER — Inpatient Hospital Stay: Payer: Medicaid Other

## 2020-03-08 VITALS — BP 132/70 | HR 92 | Temp 98.7°F | Resp 18

## 2020-03-08 DIAGNOSIS — N921 Excessive and frequent menstruation with irregular cycle: Secondary | ICD-10-CM

## 2020-03-08 DIAGNOSIS — D500 Iron deficiency anemia secondary to blood loss (chronic): Secondary | ICD-10-CM | POA: Diagnosis not present

## 2020-03-08 MED ORDER — FAMOTIDINE IN NACL 20-0.9 MG/50ML-% IV SOLN
20.0000 mg | Freq: Once | INTRAVENOUS | Status: AC
  Administered 2020-03-08: 20 mg via INTRAVENOUS

## 2020-03-08 MED ORDER — METHYLPREDNISOLONE SODIUM SUCC 125 MG IJ SOLR
80.0000 mg | Freq: Once | INTRAMUSCULAR | Status: AC
  Administered 2020-03-08: 80 mg via INTRAVENOUS

## 2020-03-08 MED ORDER — METHYLPREDNISOLONE SODIUM SUCC 125 MG IJ SOLR
INTRAMUSCULAR | Status: AC
  Filled 2020-03-08: qty 2

## 2020-03-08 MED ORDER — FAMOTIDINE IN NACL 20-0.9 MG/50ML-% IV SOLN
INTRAVENOUS | Status: AC
  Filled 2020-03-08: qty 50

## 2020-03-08 MED ORDER — SODIUM CHLORIDE 0.9 % IV SOLN
Freq: Once | INTRAVENOUS | Status: AC
  Filled 2020-03-08: qty 250

## 2020-03-08 MED ORDER — SODIUM CHLORIDE 0.9 % IV SOLN
200.0000 mg | Freq: Once | INTRAVENOUS | Status: AC
  Administered 2020-03-08: 200 mg via INTRAVENOUS
  Filled 2020-03-08: qty 200

## 2020-03-08 NOTE — Patient Instructions (Signed)

## 2020-03-11 ENCOUNTER — Telehealth: Payer: Self-pay | Admitting: *Deleted

## 2020-03-11 ENCOUNTER — Inpatient Hospital Stay: Payer: Medicaid Other

## 2020-03-11 NOTE — Telephone Encounter (Signed)
Patient called to cancel her appt today.  Wants to reschedule.  Message sent to scheduler.

## 2020-03-18 ENCOUNTER — Other Ambulatory Visit: Payer: Self-pay

## 2020-03-18 ENCOUNTER — Inpatient Hospital Stay: Payer: Medicaid Other | Attending: Hematology & Oncology

## 2020-03-18 VITALS — BP 90/43 | HR 90 | Temp 98.4°F | Resp 18

## 2020-03-18 DIAGNOSIS — N921 Excessive and frequent menstruation with irregular cycle: Secondary | ICD-10-CM

## 2020-03-18 DIAGNOSIS — D500 Iron deficiency anemia secondary to blood loss (chronic): Secondary | ICD-10-CM

## 2020-03-18 DIAGNOSIS — N920 Excessive and frequent menstruation with regular cycle: Secondary | ICD-10-CM | POA: Diagnosis present

## 2020-03-18 MED ORDER — SODIUM CHLORIDE 0.9 % IV SOLN
Freq: Once | INTRAVENOUS | Status: AC
  Filled 2020-03-18: qty 250

## 2020-03-18 MED ORDER — FAMOTIDINE IN NACL 20-0.9 MG/50ML-% IV SOLN
INTRAVENOUS | Status: AC
  Filled 2020-03-18: qty 50

## 2020-03-18 MED ORDER — FAMOTIDINE IN NACL 20-0.9 MG/50ML-% IV SOLN
20.0000 mg | Freq: Once | INTRAVENOUS | Status: AC
  Administered 2020-03-18: 20 mg via INTRAVENOUS

## 2020-03-18 MED ORDER — METHYLPREDNISOLONE SODIUM SUCC 125 MG IJ SOLR
80.0000 mg | Freq: Once | INTRAMUSCULAR | Status: AC
  Administered 2020-03-18: 80 mg via INTRAVENOUS

## 2020-03-18 MED ORDER — METHYLPREDNISOLONE SODIUM SUCC 125 MG IJ SOLR
INTRAMUSCULAR | Status: AC
  Filled 2020-03-18: qty 2

## 2020-03-18 MED ORDER — SODIUM CHLORIDE 0.9 % IV SOLN
200.0000 mg | Freq: Once | INTRAVENOUS | Status: AC
  Administered 2020-03-18: 200 mg via INTRAVENOUS
  Filled 2020-03-18: qty 200

## 2020-03-18 NOTE — Patient Instructions (Signed)

## 2020-03-22 ENCOUNTER — Ambulatory Visit: Payer: Medicaid Other

## 2020-03-25 ENCOUNTER — Ambulatory Visit: Payer: Medicaid Other

## 2020-03-31 ENCOUNTER — Other Ambulatory Visit: Payer: Self-pay

## 2020-03-31 ENCOUNTER — Inpatient Hospital Stay: Payer: Medicaid Other

## 2020-03-31 VITALS — BP 105/33 | HR 76 | Temp 98.5°F | Resp 17

## 2020-03-31 DIAGNOSIS — N921 Excessive and frequent menstruation with irregular cycle: Secondary | ICD-10-CM

## 2020-03-31 DIAGNOSIS — D500 Iron deficiency anemia secondary to blood loss (chronic): Secondary | ICD-10-CM

## 2020-03-31 MED ORDER — FAMOTIDINE IN NACL 20-0.9 MG/50ML-% IV SOLN
20.0000 mg | Freq: Once | INTRAVENOUS | Status: AC
  Administered 2020-03-31: 20 mg via INTRAVENOUS

## 2020-03-31 MED ORDER — SODIUM CHLORIDE 0.9 % IV SOLN
Freq: Once | INTRAVENOUS | Status: AC
  Filled 2020-03-31: qty 250

## 2020-03-31 MED ORDER — FAMOTIDINE IN NACL 20-0.9 MG/50ML-% IV SOLN
INTRAVENOUS | Status: AC
  Filled 2020-03-31: qty 50

## 2020-03-31 MED ORDER — SODIUM CHLORIDE 0.9 % IV SOLN
200.0000 mg | Freq: Once | INTRAVENOUS | Status: AC
  Administered 2020-03-31: 200 mg via INTRAVENOUS
  Filled 2020-03-31: qty 200

## 2020-03-31 MED ORDER — METHYLPREDNISOLONE SODIUM SUCC 40 MG IJ SOLR
INTRAMUSCULAR | Status: AC
  Filled 2020-03-31: qty 2

## 2020-03-31 MED ORDER — METHYLPREDNISOLONE SODIUM SUCC 40 MG IJ SOLR
80.0000 mg | Freq: Once | INTRAMUSCULAR | Status: AC
  Administered 2020-03-31: 80 mg via INTRAVENOUS

## 2020-03-31 MED ORDER — METHYLPREDNISOLONE SODIUM SUCC 125 MG IJ SOLR: 80.0000 mg | Freq: Once | INTRAMUSCULAR | Status: DC

## 2020-03-31 NOTE — Progress Notes (Signed)
Pt declined to stay for post infusion observation period. Pt stated she has tolerated medication multiple times prior without difficulty. Pt aware to call clinic with any questions or concerns. Pt verbalized understanding and had no further questions.  ? ?

## 2020-04-11 ENCOUNTER — Encounter: Payer: Self-pay | Admitting: Family

## 2020-04-11 ENCOUNTER — Other Ambulatory Visit: Payer: Self-pay

## 2020-04-11 ENCOUNTER — Telehealth: Payer: Self-pay | Admitting: Family

## 2020-04-11 ENCOUNTER — Inpatient Hospital Stay (HOSPITAL_BASED_OUTPATIENT_CLINIC_OR_DEPARTMENT_OTHER): Payer: Medicaid Other | Admitting: Family

## 2020-04-11 ENCOUNTER — Inpatient Hospital Stay: Payer: Medicaid Other | Attending: Hematology & Oncology

## 2020-04-11 VITALS — BP 116/54 | HR 88 | Temp 97.9°F | Resp 19 | Ht 61.0 in | Wt 254.4 lb

## 2020-04-11 DIAGNOSIS — D500 Iron deficiency anemia secondary to blood loss (chronic): Secondary | ICD-10-CM | POA: Diagnosis not present

## 2020-04-11 DIAGNOSIS — N921 Excessive and frequent menstruation with irregular cycle: Secondary | ICD-10-CM | POA: Diagnosis not present

## 2020-04-11 DIAGNOSIS — N920 Excessive and frequent menstruation with regular cycle: Secondary | ICD-10-CM | POA: Insufficient documentation

## 2020-04-11 LAB — CBC WITH DIFFERENTIAL (CANCER CENTER ONLY)
Abs Immature Granulocytes: 0.08 10*3/uL — ABNORMAL HIGH (ref 0.00–0.07)
Basophils Absolute: 0 10*3/uL (ref 0.0–0.1)
Basophils Relative: 1 %
Eosinophils Absolute: 0.1 10*3/uL (ref 0.0–0.5)
Eosinophils Relative: 1 %
HCT: 30.6 % — ABNORMAL LOW (ref 36.0–46.0)
Hemoglobin: 8.4 g/dL — ABNORMAL LOW (ref 12.0–15.0)
Immature Granulocytes: 1 %
Lymphocytes Relative: 35 %
Lymphs Abs: 2.4 10*3/uL (ref 0.7–4.0)
MCH: 18.6 pg — ABNORMAL LOW (ref 26.0–34.0)
MCHC: 27.5 g/dL — ABNORMAL LOW (ref 30.0–36.0)
MCV: 67.8 fL — ABNORMAL LOW (ref 80.0–100.0)
Monocytes Absolute: 0.3 10*3/uL (ref 0.1–1.0)
Monocytes Relative: 4 %
Neutro Abs: 4.1 10*3/uL (ref 1.7–7.7)
Neutrophils Relative %: 58 %
Platelet Count: 365 10*3/uL (ref 150–400)
RBC: 4.51 MIL/uL (ref 3.87–5.11)
RDW: 21.7 % — ABNORMAL HIGH (ref 11.5–15.5)
WBC Count: 7 10*3/uL (ref 4.0–10.5)
nRBC: 0 % (ref 0.0–0.2)

## 2020-04-11 LAB — RETICULOCYTES
Immature Retic Fract: 26.4 % — ABNORMAL HIGH (ref 2.3–15.9)
RBC.: 4.51 MIL/uL (ref 3.87–5.11)
Retic Count, Absolute: 100.6 10*3/uL (ref 19.0–186.0)
Retic Ct Pct: 2.2 % (ref 0.4–3.1)

## 2020-04-11 NOTE — Progress Notes (Signed)
Hematology and Oncology Follow Up Visit  Tracey Copeland [id number] Nov 07, 1990 29 y.o. 04/11/2020   Principle Diagnosis:  Iron deficiency anemia secondary to heavy cycle Alpha thalassemia  Current Therapy: IV iron as indicated Folic acid 1 mg PO daily   Interim History:  Tracey Copeland is here today for follow-up. She is feeling so much better after receiving Iv iron and has no complaints at this time.  Hgb is 8.4, MCV 67 and platelets 365. Iron studies are pending.  She states that her cycle is a bit more regular and she is currently on 3 medications to help regulate. Her gynecologist would like to get her Hgb back in normal range without birth control before attempting to harvest any eggs.  No other blood loss noted. No bruising or petechiae.  No fever, chills, n/v, cough, rash, dizziness, SOB, chest pain, palpitations, abdominal pain or changes in bowel or bladder habits.  No swelling, tenderness, numbness or tingling in her extremities.  No falls or syncopal episodes to report.  She states that she has maintained a good appetite and is staying well hydrated. Her weight is stable.   ECOG Performance Status: 1 - Symptomatic but completely ambulatory  Medications:  Allergies as of 04/11/2020      Reactions   Feraheme [ferumoxytol] Shortness Of Breath   Iron Anaphylaxis   Intravenous iron only   Onion Itching, Swelling, Rash      Medication List       Accurate as of April 11, 2020 12:24 PM. If you have any questions, ask your nurse or doctor.        STOP taking these medications   cyclobenzaprine 10 MG tablet Commonly known as: FLEXERIL Stopped by: Laverna Peace, NP   methocarbamol 500 MG tablet Commonly known as: ROBAXIN Stopped by: Laverna Peace, NP   naproxen 500 MG tablet Commonly known as: NAPROSYN Stopped by: Laverna Peace, NP     TAKE these medications   albuterol 108 (90 Base) MCG/ACT inhaler Commonly known as: VENTOLIN  HFA Inhale 2 puffs into the lungs every 6 (six) hours as needed for wheezing or shortness of breath.   EPINEPHrine 0.3 mg/0.3 mL Soaj injection Commonly known as: EPI-PEN Inject into the muscle.   ergocalciferol 1.25 MG (50000 UT) capsule Commonly known as: VITAMIN D2 Take 1 capsule (50,000 Units total) by mouth once a week.   ferrous sulfate 325 (65 FE) MG tablet Take 1 tablet (325 mg total) by mouth 2 (two) times daily with a meal.   folic acid 1 MG tablet Commonly known as: FOLVITE Take 1 tablet (1 mg total) by mouth daily.   norethindrone-ethinyl estradiol 0.5/0.75/1-35 MG-MCG tablet Commonly known as: CYCLAFEM Take 1 tablet by mouth daily.   norgestimate-ethinyl estradiol 0.25-35 MG-MCG tablet Commonly known as: ORTHO-CYCLEN Take 1 tablet by mouth daily.   ONE-A-DAY WOMENS PO Take 1 tablet by mouth daily.   tranexamic acid 650 MG Tabs tablet Commonly known as: LYSTEDA Take 650 mg by mouth 2 (two) times daily.       Allergies:  Allergies  Allergen Reactions  . Feraheme [Ferumoxytol] Shortness Of Breath  . Iron Anaphylaxis    Intravenous iron only  . Onion Itching, Swelling and Rash    Past Medical History, Surgical history, Social history, and Family History were reviewed and updated.  Review of Systems: All other 10 point review of systems is negative.   Physical Exam:  height is 5\' 1"  (1.549 m) and weight is 254 lb 6.4 oz (115.4  kg). Her oral temperature is 97.9 F (36.6 C). Her blood pressure is 116/54 (abnormal) and her pulse is 88. Her respiration is 19 and oxygen saturation is 100%.   Wt Readings from Last 3 Encounters:  04/11/20 254 lb 6.4 oz (115.4 kg)  02/29/20 255 lb 12.8 oz (116 kg)  02/05/20 (!) 257 lb (116.6 kg)    Ocular: Sclerae unicteric, pupils equal, round and reactive to light Ear-nose-throat: Oropharynx clear, dentition fair Lymphatic: No cervical or supraclavicular adenopathy Lungs no rales or rhonchi, good excursion  bilaterally Heart regular rate and rhythm, no murmur appreciated Abd soft, nontender, positive bowel sounds MSK no focal spinal tenderness, no joint edema Neuro: non-focal, well-oriented, appropriate affect Breasts: Deferred   Lab Results  Component Value Date   WBC 7.0 04/11/2020   HGB 8.4 (L) 04/11/2020   HCT 30.6 (L) 04/11/2020   MCV 67.8 (L) 04/11/2020   PLT 365 04/11/2020   Lab Results  Component Value Date   FERRITIN 5 (L) 02/29/2020   IRON 9 (L) 02/29/2020   TIBC 403 02/29/2020   UIBC 393 (H) 02/29/2020   IRONPCTSAT 2 (L) 02/29/2020   Lab Results  Component Value Date   RETICCTPCT 2.2 04/11/2020   RBC 4.51 04/11/2020   No results found for: KPAFRELGTCHN, LAMBDASER, KAPLAMBRATIO No results found for: IGGSERUM, IGA, IGMSERUM No results found for: Odetta Pink, SPEI   Chemistry      Component Value Date/Time   NA 137 05/23/2019 0450   K 3.7 05/23/2019 0450   CL 105 05/23/2019 0450   CO2 25 05/23/2019 0450   BUN 16 05/23/2019 0450   CREATININE 0.92 05/23/2019 0450   CREATININE 0.77 09/12/2018 1300      Component Value Date/Time   CALCIUM 8.6 (L) 05/23/2019 0450   ALKPHOS 102 05/23/2019 0450   AST 13 (L) 05/23/2019 0450   AST 11 (L) 09/12/2018 1300   ALT 16 05/23/2019 0450   ALT 13 09/12/2018 1300   BILITOT 0.4 05/23/2019 0450   BILITOT 0.2 (L) 09/12/2018 1300       Impression and Plan: Tracey Copeland is a very pleasant 29 yo African American female with iron deficiency anemia secondary to heavy cycles.  Iron studies are pending. We will replace again if needed.  She verbalized that she is taking her folic acid daily and will continue her same regimen.  We will plan to see her again in 2 months.  She can contact our office with any questions or concerns.   Laverna Peace, NP 10/4/202112:24 PM

## 2020-04-11 NOTE — Telephone Encounter (Signed)
Appointments scheduled patient has My Chart Access per 10/4 los

## 2020-04-12 LAB — FERRITIN: Ferritin: 39 ng/mL (ref 11–307)

## 2020-04-12 LAB — IRON AND TIBC
Iron: 18 ug/dL — ABNORMAL LOW (ref 41–142)
Saturation Ratios: 5 % — ABNORMAL LOW (ref 21–57)
TIBC: 330 ug/dL (ref 236–444)
UIBC: 312 ug/dL (ref 120–384)

## 2020-04-25 ENCOUNTER — Encounter (HOSPITAL_BASED_OUTPATIENT_CLINIC_OR_DEPARTMENT_OTHER): Payer: Self-pay | Admitting: *Deleted

## 2020-04-25 ENCOUNTER — Emergency Department (HOSPITAL_BASED_OUTPATIENT_CLINIC_OR_DEPARTMENT_OTHER)
Admission: EM | Admit: 2020-04-25 | Discharge: 2020-04-25 | Disposition: A | Discharge status: Home / Self Care | Payer: Medicaid Other | Attending: Emergency Medicine | Admitting: Emergency Medicine

## 2020-04-25 ENCOUNTER — Emergency Department (HOSPITAL_BASED_OUTPATIENT_CLINIC_OR_DEPARTMENT_OTHER): Payer: Medicaid Other

## 2020-04-25 ENCOUNTER — Other Ambulatory Visit: Payer: Self-pay

## 2020-04-25 DIAGNOSIS — S90112A Contusion of left great toe without damage to nail, initial encounter: Secondary | ICD-10-CM | POA: Insufficient documentation

## 2020-04-25 DIAGNOSIS — W208XXA Other cause of strike by thrown, projected or falling object, initial encounter: Secondary | ICD-10-CM | POA: Insufficient documentation

## 2020-04-25 DIAGNOSIS — J45909 Unspecified asthma, uncomplicated: Secondary | ICD-10-CM | POA: Diagnosis not present

## 2020-04-25 DIAGNOSIS — S90932A Unspecified superficial injury of left great toe, initial encounter: Secondary | ICD-10-CM | POA: Diagnosis present

## 2020-04-25 NOTE — ED Triage Notes (Signed)
Pt reports an iron fell and landed on her left big toe. Swelling and discoloration noted

## 2020-04-25 NOTE — ED Provider Notes (Signed)
[facility] Provider Note   CSN: [id number] Arrival date & time: 04/25/20  0023     History Chief Complaint  Patient presents with  . Foot Pain    Tracey Copeland is a 29 y.o. female.  Patient presents to the emergency department for evaluation of left great toe injury.  Patient reports that an iron fell and landed on her toe.  The iron was not plugging or hot.  Patient having some swelling and pain of the toe ever since the injury.        Past Medical History:  Diagnosis Date  . Anemia   . Asthma   . Bipolar 1 disorder ([place])   . Blood transfusion without reported diagnosis   . Schizophrenia [facility])     Patient Active Problem List   Diagnosis Date Noted  . IDA (iron deficiency anemia) 09/11/2018  . Symptomatic anemia 08/24/2018  . Menorrhagia 03/23/2018  . Anemia 03/23/2018    Past Surgical History:  Procedure Laterality Date  . DILATION AND CURETTAGE OF UTERUS       OB History   No obstetric history on file.     No family history on file.  Social History   Tobacco Use  . Smoking status: Never Smoker  . Smokeless tobacco: Never Used  Vaping Use  . Vaping Use: Never used  Substance Use Topics  . Alcohol use: No  . Drug use: No    Home Medications Prior to Admission medications   Medication Sig Start Date End Date Taking? Authorizing Provider  albuterol (PROVENTIL HFA;VENTOLIN HFA) 108 (90 Base) MCG/ACT inhaler Inhale 2 puffs into the lungs every 6 (six) hours as needed for wheezing or shortness of breath.    [provider]  EPINEPHrine 0.3 mg/0.3 mL IJ SOAJ injection Inject into the muscle. Patient not taking: Reported on 04/11/2020 01/10/19   [provider]  ergocalciferol (VITAMIN D2) 1.25 MG (50000 UT) capsule Take 1 capsule (50,000 Units total) by mouth once a week. 09/23/18   [place], Holli Humbles, NP  ferrous sulfate 325 (65 FE) MG tablet Take 1 tablet (325 mg total) by mouth 2 (two) times  daily with a meal. 02/06/20   Faron Whitelock, Gwenyth Allegra, MD  folic acid (FOLVITE) 1 MG tablet Take 1 tablet (1 mg total) by mouth daily. 02/29/20   [place], Holli Humbles, NP  Multiple Vitamins-Calcium (ONE-A-DAY WOMENS PO) Take 1 tablet by mouth daily.    [provider]  norethindrone-ethinyl estradiol (CYCLAFEM) 0.5/0.75/1-35 MG-MCG tablet Take 1 tablet by mouth daily.    [provider]  norgestimate-ethinyl estradiol (ORTHO-CYCLEN) 0.25-35 MG-MCG tablet Take 1 tablet by mouth daily. 03/31/19 04/11/20  [provider]  tranexamic acid (LYSTEDA) 650 MG TABS tablet Take 650 mg by mouth 2 (two) times daily.     [provider]    Allergies    Feraheme [ferumoxytol], Iron, and Onion  Review of Systems   Review of Systems  Musculoskeletal: Positive for arthralgias (Toe pain).  Skin: Positive for color change.    Physical Exam Updated Vital Signs BP (!) 109/59 (BP Location: Left Arm)   Pulse 86   Temp 98.6 F (37 C) (Oral)   Resp 16   Ht 5\' 1"  (1.549 m)   Wt 106.1 kg   LMP 04/24/2020   SpO2 100%   BMI 44.21 kg/m   Physical Exam Constitutional:      Appearance: Normal appearance.  HENT:     Head: Atraumatic.  Musculoskeletal:  General: No swelling or deformity. Normal range of motion.  Skin:    Comments: Bruising to distal portion of left great toe at corner of toenail  Toenail heavily curved at edges, burrowing into skin.  Neurological:     Mental Status: She is alert.     Sensory: Sensory deficit present.     ED Results / Procedures / Treatments   Labs (all labs ordered are listed, but only abnormal results are displayed) Labs Reviewed - No data to display  EKG None  Radiology DG Toe Great Left  Result Date: 04/25/2020 CLINICAL DATA:  29 year old female with trauma to the left great toe. EXAM: LEFT GREAT TOE COMPARISON:  None. FINDINGS: There is no evidence of fracture or dislocation. There is no evidence of arthropathy or  other focal bone abnormality. Soft tissues are unremarkable. IMPRESSION: Negative. Electronically Signed   By: Anner Crete M.D.   On: 04/25/2020 01:17    Procedures Procedures (including critical care time)  Medications Ordered in ED Medications - No data to display  ED Course  I have reviewed the triage vital signs and the nursing notes.  Pertinent labs & imaging results that were available during my care of the patient were reviewed by me and considered in my medical decision making (see chart for details).    MDM Rules/Calculators/A&P                          Patient with some bruising at the distal portion of the toe.  She does have some evidence of possible early ingrown toenail as well.  Recommend warm soaks, weightbearing as tolerated.  Follow-up with podiatry.    Final Clinical Impression(s) / ED Diagnoses Final diagnoses:  Contusion of left great toe without damage to nail, initial encounter    Rx / DC Orders ED Discharge Orders    None       Corleen Otwell, Gwenyth Allegra, MD 04/25/20 [phone]

## 2020-04-27 ENCOUNTER — Other Ambulatory Visit: Payer: Self-pay

## 2020-04-27 ENCOUNTER — Inpatient Hospital Stay: Payer: Medicaid Other

## 2020-04-27 VITALS — BP 109/68 | HR 70 | Temp 97.8°F | Resp 17

## 2020-04-27 DIAGNOSIS — D500 Iron deficiency anemia secondary to blood loss (chronic): Secondary | ICD-10-CM | POA: Diagnosis not present

## 2020-04-27 DIAGNOSIS — N921 Excessive and frequent menstruation with irregular cycle: Secondary | ICD-10-CM

## 2020-04-27 MED ORDER — FAMOTIDINE IN NACL 20-0.9 MG/50ML-% IV SOLN
20.0000 mg | Freq: Once | INTRAVENOUS | Status: AC
  Administered 2020-04-27: 20 mg via INTRAVENOUS

## 2020-04-27 MED ORDER — SODIUM CHLORIDE 0.9 % IV SOLN
Freq: Once | INTRAVENOUS | Status: AC
  Filled 2020-04-27: qty 250

## 2020-04-27 MED ORDER — METHYLPREDNISOLONE SODIUM SUCC 125 MG IJ SOLR
80.0000 mg | Freq: Once | INTRAMUSCULAR | Status: AC
  Administered 2020-04-27: 80 mg via INTRAVENOUS

## 2020-04-27 MED ORDER — FAMOTIDINE IN NACL 20-0.9 MG/50ML-% IV SOLN
INTRAVENOUS | Status: AC
  Filled 2020-04-27: qty 50

## 2020-04-27 MED ORDER — SODIUM CHLORIDE 0.9 % IV SOLN
200.0000 mg | Freq: Once | INTRAVENOUS | Status: AC
  Administered 2020-04-27: 200 mg via INTRAVENOUS
  Filled 2020-04-27: qty 200

## 2020-04-27 MED ORDER — METHYLPREDNISOLONE SODIUM SUCC 125 MG IJ SOLR
INTRAMUSCULAR | Status: AC
  Filled 2020-04-27: qty 2

## 2020-04-27 NOTE — Patient Instructions (Signed)

## 2020-04-27 NOTE — Progress Notes (Signed)
Patient stable upon discharge.  

## 2020-04-29 ENCOUNTER — Other Ambulatory Visit: Payer: Self-pay | Admitting: Family

## 2020-04-29 ENCOUNTER — Inpatient Hospital Stay: Payer: Medicaid Other

## 2020-05-02 ENCOUNTER — Inpatient Hospital Stay: Payer: Medicaid Other

## 2020-05-04 ENCOUNTER — Inpatient Hospital Stay: Payer: Medicaid Other

## 2020-05-06 ENCOUNTER — Inpatient Hospital Stay: Payer: Medicaid Other

## 2020-05-06 ENCOUNTER — Other Ambulatory Visit: Payer: Self-pay

## 2020-05-06 VITALS — BP 104/39 | HR 76 | Temp 99.0°F | Resp 18

## 2020-05-06 DIAGNOSIS — D500 Iron deficiency anemia secondary to blood loss (chronic): Secondary | ICD-10-CM

## 2020-05-06 DIAGNOSIS — N921 Excessive and frequent menstruation with irregular cycle: Secondary | ICD-10-CM

## 2020-05-06 MED ORDER — SODIUM CHLORIDE 0.9 % IV SOLN
200.0000 mg | Freq: Once | INTRAVENOUS | Status: AC
  Administered 2020-05-06: 200 mg via INTRAVENOUS
  Filled 2020-05-06: qty 200

## 2020-05-06 MED ORDER — SODIUM CHLORIDE 0.9 % IV SOLN
Freq: Once | INTRAVENOUS | Status: AC
  Filled 2020-05-06: qty 250

## 2020-05-06 MED ORDER — METHYLPREDNISOLONE SODIUM SUCC 125 MG IJ SOLR
80.0000 mg | Freq: Once | INTRAMUSCULAR | Status: AC
  Administered 2020-05-06: 80 mg via INTRAVENOUS

## 2020-05-06 MED ORDER — FAMOTIDINE IN NACL 20-0.9 MG/50ML-% IV SOLN
INTRAVENOUS | Status: AC
  Filled 2020-05-06: qty 50

## 2020-05-06 MED ORDER — METHYLPREDNISOLONE SODIUM SUCC 125 MG IJ SOLR
INTRAMUSCULAR | Status: AC
  Filled 2020-05-06: qty 2

## 2020-05-06 MED ORDER — FAMOTIDINE IN NACL 20-0.9 MG/50ML-% IV SOLN
20.0000 mg | Freq: Once | INTRAVENOUS | Status: AC
  Administered 2020-05-06: 20 mg via INTRAVENOUS

## 2020-05-06 NOTE — Patient Instructions (Signed)

## 2020-05-06 NOTE — Progress Notes (Signed)
Pt discharged in no apparent distress. Pt left ambulatory without assistance. Pt aware of discharge instructions and verbalized understanding and had no further questions.  

## 2020-05-09 ENCOUNTER — Inpatient Hospital Stay: Payer: Medicaid Other

## 2020-05-11 ENCOUNTER — Other Ambulatory Visit: Payer: Self-pay

## 2020-05-11 ENCOUNTER — Inpatient Hospital Stay: Payer: Medicaid Other | Attending: Hematology & Oncology

## 2020-05-11 VITALS — BP 107/69 | HR 76 | Temp 99.0°F | Resp 18

## 2020-05-11 DIAGNOSIS — D500 Iron deficiency anemia secondary to blood loss (chronic): Secondary | ICD-10-CM | POA: Insufficient documentation

## 2020-05-11 DIAGNOSIS — N920 Excessive and frequent menstruation with regular cycle: Secondary | ICD-10-CM | POA: Insufficient documentation

## 2020-05-11 DIAGNOSIS — N921 Excessive and frequent menstruation with irregular cycle: Secondary | ICD-10-CM

## 2020-05-11 MED ORDER — METHYLPREDNISOLONE SODIUM SUCC 125 MG IJ SOLR
INTRAMUSCULAR | Status: AC
  Filled 2020-05-11: qty 2

## 2020-05-11 MED ORDER — SODIUM CHLORIDE 0.9 % IV SOLN
200.0000 mg | Freq: Once | INTRAVENOUS | Status: AC
  Administered 2020-05-11: 200 mg via INTRAVENOUS
  Filled 2020-05-11: qty 200

## 2020-05-11 MED ORDER — FAMOTIDINE IN NACL 20-0.9 MG/50ML-% IV SOLN
20.0000 mg | Freq: Once | INTRAVENOUS | Status: AC
  Administered 2020-05-11: 20 mg via INTRAVENOUS

## 2020-05-11 MED ORDER — FAMOTIDINE IN NACL 20-0.9 MG/50ML-% IV SOLN
INTRAVENOUS | Status: AC
  Filled 2020-05-11: qty 50

## 2020-05-11 MED ORDER — SODIUM CHLORIDE 0.9 % IV SOLN
Freq: Once | INTRAVENOUS | Status: AC
  Filled 2020-05-11: qty 250

## 2020-05-11 MED ORDER — METHYLPREDNISOLONE SODIUM SUCC 125 MG IJ SOLR
80.0000 mg | Freq: Once | INTRAMUSCULAR | Status: AC
  Administered 2020-05-11: 80 mg via INTRAVENOUS

## 2020-05-11 NOTE — Progress Notes (Signed)
Pt. stable and asymptomatic at discharge.

## 2020-05-11 NOTE — Patient Instructions (Signed)

## 2020-05-13 ENCOUNTER — Inpatient Hospital Stay: Payer: Medicaid Other

## 2020-05-18 ENCOUNTER — Inpatient Hospital Stay: Payer: Medicaid Other

## 2020-05-18 ENCOUNTER — Other Ambulatory Visit: Payer: Self-pay

## 2020-05-18 VITALS — BP 111/62 | HR 70 | Temp 98.8°F | Resp 17

## 2020-05-18 DIAGNOSIS — D500 Iron deficiency anemia secondary to blood loss (chronic): Secondary | ICD-10-CM

## 2020-05-18 DIAGNOSIS — N921 Excessive and frequent menstruation with irregular cycle: Secondary | ICD-10-CM

## 2020-05-18 MED ORDER — SODIUM CHLORIDE 0.9 % IV SOLN
200.0000 mg | Freq: Once | INTRAVENOUS | Status: AC
  Administered 2020-05-18: 200 mg via INTRAVENOUS
  Filled 2020-05-18: qty 200

## 2020-05-18 MED ORDER — SODIUM CHLORIDE 0.9 % IV SOLN
Freq: Once | INTRAVENOUS | Status: AC
  Filled 2020-05-18: qty 250

## 2020-05-18 MED ORDER — FAMOTIDINE IN NACL 20-0.9 MG/50ML-% IV SOLN
INTRAVENOUS | Status: AC
  Filled 2020-05-18: qty 50

## 2020-05-18 MED ORDER — METHYLPREDNISOLONE SODIUM SUCC 125 MG IJ SOLR
80.0000 mg | Freq: Once | INTRAMUSCULAR | Status: AC
  Administered 2020-05-18: 80 mg via INTRAVENOUS

## 2020-05-18 MED ORDER — FAMOTIDINE IN NACL 20-0.9 MG/50ML-% IV SOLN
20.0000 mg | Freq: Once | INTRAVENOUS | Status: AC
  Administered 2020-05-18: 20 mg via INTRAVENOUS

## 2020-05-18 MED ORDER — METHYLPREDNISOLONE SODIUM SUCC 125 MG IJ SOLR
INTRAMUSCULAR | Status: AC
  Filled 2020-05-18: qty 2

## 2020-05-18 NOTE — Progress Notes (Signed)
Pt declined to stay for post infusion observation period. Pt stated she has tolerated medication multiple times prior without difficulty. Pt aware to call clinic with any questions or concerns. Pt verbalized understanding and had no further questions.  Pt discharged in stable condition.  

## 2020-06-10 ENCOUNTER — Inpatient Hospital Stay: Payer: Medicaid Other | Admitting: Family

## 2020-06-10 ENCOUNTER — Inpatient Hospital Stay: Payer: Medicaid Other

## 2020-06-10 ENCOUNTER — Telehealth: Payer: Self-pay

## 2020-06-10 NOTE — Telephone Encounter (Signed)
Returned pts call to r/s todays appts,,,, done   AOM

## 2020-06-13 ENCOUNTER — Encounter (HOSPITAL_BASED_OUTPATIENT_CLINIC_OR_DEPARTMENT_OTHER): Payer: Self-pay | Admitting: Emergency Medicine

## 2020-06-13 ENCOUNTER — Emergency Department (HOSPITAL_BASED_OUTPATIENT_CLINIC_OR_DEPARTMENT_OTHER)
Admission: EM | Admit: 2020-06-13 | Discharge: 2020-06-13 | Disposition: A | Discharge status: Home / Self Care | Payer: Medicaid Other | Attending: Emergency Medicine | Admitting: Emergency Medicine

## 2020-06-13 ENCOUNTER — Other Ambulatory Visit: Payer: Self-pay

## 2020-06-13 ENCOUNTER — Emergency Department (HOSPITAL_BASED_OUTPATIENT_CLINIC_OR_DEPARTMENT_OTHER): Payer: Medicaid Other

## 2020-06-13 DIAGNOSIS — D649 Anemia, unspecified: Secondary | ICD-10-CM

## 2020-06-13 DIAGNOSIS — J45909 Unspecified asthma, uncomplicated: Secondary | ICD-10-CM | POA: Diagnosis not present

## 2020-06-13 DIAGNOSIS — R5383 Other fatigue: Secondary | ICD-10-CM

## 2020-06-13 DIAGNOSIS — R0602 Shortness of breath: Secondary | ICD-10-CM

## 2020-06-13 LAB — CBC WITH DIFFERENTIAL/PLATELET
Abs Immature Granulocytes: 0.04 10*3/uL (ref 0.00–0.07)
Basophils Absolute: 0 10*3/uL (ref 0.0–0.1)
Basophils Relative: 0 %
Eosinophils Absolute: 0.1 10*3/uL (ref 0.0–0.5)
Eosinophils Relative: 1 %
HCT: 28 % — ABNORMAL LOW (ref 36.0–46.0)
Hemoglobin: 8.2 g/dL — ABNORMAL LOW (ref 12.0–15.0)
Immature Granulocytes: 1 %
Lymphocytes Relative: 43 %
Lymphs Abs: 3.1 10*3/uL (ref 0.7–4.0)
MCH: 20.4 pg — ABNORMAL LOW (ref 26.0–34.0)
MCHC: 29.3 g/dL — ABNORMAL LOW (ref 30.0–36.0)
MCV: 69.8 fL — ABNORMAL LOW (ref 80.0–100.0)
Monocytes Absolute: 0.4 10*3/uL (ref 0.1–1.0)
Monocytes Relative: 5 %
Neutro Abs: 3.7 10*3/uL (ref 1.7–7.7)
Neutrophils Relative %: 50 %
Platelets: 399 10*3/uL (ref 150–400)
RBC: 4.01 MIL/uL (ref 3.87–5.11)
RDW: 21.9 % — ABNORMAL HIGH (ref 11.5–15.5)
Smear Review: NORMAL
WBC: 7.3 10*3/uL (ref 4.0–10.5)
nRBC: 0 % (ref 0.0–0.2)

## 2020-06-13 LAB — URINALYSIS, ROUTINE W REFLEX MICROSCOPIC
Bilirubin Urine: NEGATIVE
Glucose, UA: NEGATIVE mg/dL
Ketones, ur: NEGATIVE mg/dL
Leukocytes,Ua: NEGATIVE
Nitrite: NEGATIVE
Protein, ur: 30 mg/dL — AB
Specific Gravity, Urine: >1.03 — ABNORMAL HIGH (ref 1.005–1.030)
pH: 5.5 (ref 5.0–8.0)

## 2020-06-13 LAB — URINALYSIS, MICROSCOPIC (REFLEX): RBC / HPF: >50 RBC/hpf (ref 0–5)

## 2020-06-13 LAB — BASIC METABOLIC PANEL
Anion gap: 11 (ref 5–15)
BUN: 10 mg/dL (ref 6–20)
CO2: 23 mmol/L (ref 22–32)
Calcium: 8.4 mg/dL — ABNORMAL LOW (ref 8.9–10.3)
Chloride: 104 mmol/L (ref 98–111)
Creatinine, Ser: 0.7 mg/dL (ref 0.44–1.00)
GFR, Estimated: >60 mL/min (ref >60)
Glucose, Bld: 160 mg/dL — ABNORMAL HIGH (ref 70–99)
Potassium: 3.2 mmol/L — ABNORMAL LOW (ref 3.5–5.1)
Sodium: 138 mmol/L (ref 135–145)

## 2020-06-13 LAB — PREGNANCY, URINE: Preg Test, Ur: NEGATIVE

## 2020-06-13 MED ORDER — SODIUM CHLORIDE 0.9 % IV BOLUS
500.0000 mL | Freq: Once | INTRAVENOUS | Status: AC
  Administered 2020-06-13: 500 mL via INTRAVENOUS

## 2020-06-13 NOTE — Discharge Instructions (Addendum)
You were seen today and had persistent anemia.  Your hemoglobin is 8.2.  This is consistent with prior hemoglobins.  Your other work-up is reassuring.  Follow-up with your hematologist regarding ongoing management and iron infusions.  At this time, I would also recommend you follow-up with your gynecologist regarding additional options to reduce blood loss.

## 2020-06-13 NOTE — ED Provider Notes (Signed)
[facility] EMERGENCY DEPARTMENT Provider Note   CSN: [id number] Arrival date & time: 06/13/20  0341     History Chief Complaint  Patient presents with  . Fatigue    Tracey Copeland is a 29 y.o. female.  HPI     This is a 29 year old female with a history of menorrhagia, iron deficiency anemia, bipolar disorder who presents with worsening fatigue and shortness of breath.  Patient reports that she has had daily vaginal bleeding since September.  She reports that she uses approximately 3 pads per day.  She has had issues with ongoing anemia secondary to chronic blood loss.  She has had iron infusions twice recently.  She states she felt much better after the first 1 but not much improvement after the second 1.  She is also previously had blood transfusions.  She states over the last several days she has had worsening shortness of breath on exertion, fatigue.  She also describes dizziness with position changes.  She states that this morning she was in bed when her significant other noticed that she appeared to be having difficulty breathing.  No recent illnesses or fevers.  Past Medical History:  Diagnosis Date  . Anemia   . Asthma   . Bipolar 1 disorder ([place])   . Blood transfusion without reported diagnosis   . Schizophrenia [facility])     Patient Active Problem List   Diagnosis Date Noted  . IDA (iron deficiency anemia) 09/11/2018  . Symptomatic anemia 08/24/2018  . Menorrhagia 03/23/2018  . Anemia 03/23/2018    Past Surgical History:  Procedure Laterality Date  . DILATION AND CURETTAGE OF UTERUS       OB History   No obstetric history on file.     History reviewed. No pertinent family history.  Social History   Tobacco Use  . Smoking status: Never Smoker  . Smokeless tobacco: Never Used  Vaping Use  . Vaping Use: Never used  Substance Use Topics  . Alcohol use: No  . Drug use: No    Home Medications Prior to Admission medications   Medication  Sig Start Date End Date Taking? Authorizing Provider  sulfamethoxazole-trimethoprim (BACTRIM DS) 800-160 MG tablet Take by mouth. 06/12/20 06/22/20 Yes [provider]  albuterol (PROVENTIL HFA;VENTOLIN HFA) 108 (90 Base) MCG/ACT inhaler Inhale 2 puffs into the lungs every 6 (six) hours as needed for wheezing or shortness of breath.    [provider]  EPINEPHrine 0.3 mg/0.3 mL IJ SOAJ injection Inject into the muscle. Patient not taking: Reported on 04/11/2020 01/10/19   [provider]  ergocalciferol (VITAMIN D2) 1.25 MG (50000 UT) capsule Take 1 capsule (50,000 Units total) by mouth once a week. 09/23/18   [place], Holli Humbles, NP  ferrous sulfate 325 (65 FE) MG tablet Take 1 tablet (325 mg total) by mouth 2 (two) times daily with a meal. 02/06/20   [place], Gwenyth Allegra, MD  folic acid (FOLVITE) 1 MG tablet Take 1 tablet (1 mg total) by mouth daily. 02/29/20   [place], Holli Humbles, NP  Multiple Vitamins-Calcium (ONE-A-DAY WOMENS PO) Take 1 tablet by mouth daily.    [provider]  norethindrone-ethinyl estradiol (CYCLAFEM) 0.5/0.75/1-35 MG-MCG tablet Take 1 tablet by mouth daily.    [provider]  norgestimate-ethinyl estradiol (ORTHO-CYCLEN) 0.25-35 MG-MCG tablet Take 1 tablet by mouth daily. 03/31/19 04/11/20  [provider]  tranexamic acid (LYSTEDA) 650 MG TABS tablet Take 650 mg by mouth 2 (two) times daily.  [provider]    Allergies    Feraheme [ferumoxytol], Iron, and Onion  Review of Systems   Review of Systems  Constitutional: Negative for fever.  Respiratory: Positive for shortness of breath. Negative for cough.   Cardiovascular: Negative for chest pain.  Gastrointestinal: Negative for abdominal pain, nausea and vomiting.  Genitourinary: Positive for vaginal bleeding.  Neurological: Positive for dizziness.  All other systems reviewed and are negative.   Physical Exam Updated Vital Signs BP (!) 123/56 (BP  Location: Right Arm)   Pulse 81   Temp 98.5 F (36.9 C) (Oral)   Resp 18   Ht 1.549 m (5\' 1" )   Wt 120.2 kg   SpO2 100%   BMI 50.07 kg/m   Physical Exam Vitals and nursing note reviewed.  Constitutional:      Appearance: She is well-developed. She is obese. She is not ill-appearing.  HENT:     Head: Normocephalic and atraumatic.     Nose: Nose normal.     Mouth/Throat:     Mouth: Mucous membranes are moist.  Eyes:     Pupils: Pupils are equal, round, and reactive to light.  Cardiovascular:     Rate and Rhythm: Normal rate and regular rhythm.     Heart sounds: Normal heart sounds.  Pulmonary:     Effort: Pulmonary effort is normal. No respiratory distress.     Breath sounds: No wheezing.  Abdominal:     General: Bowel sounds are normal.     Palpations: Abdomen is soft.     Tenderness: There is no abdominal tenderness.  Musculoskeletal:     Cervical back: Neck supple.     Right lower leg: No edema.     Left lower leg: No edema.  Skin:    General: Skin is warm and dry.  Neurological:     Mental Status: She is alert and oriented to person, place, and time.  Psychiatric:        Mood and Affect: Mood normal.     ED Results / Procedures / Treatments   Labs (all labs ordered are listed, but only abnormal results are displayed) Labs Reviewed  CBC WITH DIFFERENTIAL/PLATELET - Abnormal; Notable for the following components:      Result Value   Hemoglobin 8.2 (*)    HCT 28.0 (*)    MCV 69.8 (*)    MCH 20.4 (*)    MCHC 29.3 (*)    RDW 21.9 (*)    All other components within normal limits  BASIC METABOLIC PANEL - Abnormal; Notable for the following components:   Potassium 3.2 (*)    Glucose, Bld 160 (*)    Calcium 8.4 (*)    All other components within normal limits  URINALYSIS, ROUTINE W REFLEX MICROSCOPIC - Abnormal; Notable for the following components:   Color, Urine RED (*)    APPearance CLOUDY (*)    Specific Gravity, Urine >1.030 (*)    Hgb urine dipstick  LARGE (*)    Protein, ur 30 (*)    All other components within normal limits  URINALYSIS, MICROSCOPIC (REFLEX) - Abnormal; Notable for the following components:   Bacteria, UA FEW (*)    All other components within normal limits  PREGNANCY, URINE    EKG None  Radiology DG Chest Portable 1 View  Result Date: 06/13/2020 CLINICAL DATA:  Shortness of breath. EXAM: PORTABLE CHEST 1 VIEW COMPARISON:  05/23/2019 FINDINGS: The cardiac silhouette, mediastinal and hilar contours are normal and stable. The  lungs are clear. No pleural effusions. No pulmonary lesions. The bony thorax is intact. IMPRESSION: No acute cardiopulmonary findings. Electronically Signed   By: Marijo Sanes M.D.   On: 06/13/2020 05:47    Procedures Procedures (including critical care time)  Medications Ordered in ED Medications  sodium chloride 0.9 % bolus 500 mL (500 mLs Intravenous New Bag/Given 06/13/20 0528)    ED Course  I have reviewed the triage vital signs and the nursing notes.  Pertinent labs & imaging results that were available during my care of the patient were reviewed by me and considered in my medical decision making (see chart for details).    MDM Rules/Calculators/A&P                          Patient presents with worsening fatigue and shortness of breath.  History of significant anemia and has ongoing vaginal bleeding.  She is overall nontoxic and vital signs are reassuring.  At rest she is not tachycardic or hypotensive.  However, with standing her blood pressure does drop from 1 69-45 systolic.  She was given 500 cc of fluid.  Hemoglobin today is 8.2.  Last documented hemoglobin 8.4.  No indication for transfusion at this time although I do feel her symptoms are likely related to her ongoing anemia.  Chest x-ray is without evidence of pneumothorax or pneumonia.  She denies any infectious symptoms.  No chest pain to suggest ACS.  Symptoms are acute on chronic.  Recommend close follow-up with her  hematologist regarding ongoing options for iron infusion as well as follow-up with her gynecologist regarding options for stopping vaginal bleeding.  Patient stated understanding.  After history, exam, and medical workup I feel the patient has been appropriately medically screened and is safe for discharge home. Pertinent diagnoses were discussed with the patient. Patient was given return precautions.  Final Clinical Impression(s) / ED Diagnoses Final diagnoses:  Symptomatic anemia  Other fatigue  SOB (shortness of breath)    Rx / DC Orders ED Discharge Orders    None       Genesis Novosad, Barbette Hair, MD 06/13/20 [phone]

## 2020-06-13 NOTE — ED Triage Notes (Signed)
Reports being on her menstrual cycle since September but has been feeling worse over the last couple of days. States she's been doing iron infusions but lately hasn't been feeling better with them. States dizziness with some position changes. States some snoring/"whining" while laying on her side tonight - prompting her to come to ED.

## 2020-06-28 ENCOUNTER — Inpatient Hospital Stay (HOSPITAL_BASED_OUTPATIENT_CLINIC_OR_DEPARTMENT_OTHER): Payer: Medicaid Other | Admitting: Family

## 2020-06-28 ENCOUNTER — Inpatient Hospital Stay: Payer: Medicaid Other | Attending: Hematology & Oncology

## 2020-06-28 ENCOUNTER — Telehealth: Payer: Self-pay | Admitting: Family

## 2020-06-28 ENCOUNTER — Encounter: Payer: Self-pay | Admitting: Family

## 2020-06-28 ENCOUNTER — Other Ambulatory Visit: Payer: Self-pay

## 2020-06-28 VITALS — BP 117/47 | HR 85 | Temp 98.0°F | Resp 20 | Ht 61.0 in

## 2020-06-28 DIAGNOSIS — N920 Excessive and frequent menstruation with regular cycle: Secondary | ICD-10-CM | POA: Diagnosis present

## 2020-06-28 DIAGNOSIS — N921 Excessive and frequent menstruation with irregular cycle: Secondary | ICD-10-CM

## 2020-06-28 DIAGNOSIS — D500 Iron deficiency anemia secondary to blood loss (chronic): Secondary | ICD-10-CM

## 2020-06-28 LAB — CBC WITH DIFFERENTIAL (CANCER CENTER ONLY)
Abs Immature Granulocytes: 0.07 10*3/uL (ref 0.00–0.07)
Basophils Absolute: 0 10*3/uL (ref 0.0–0.1)
Basophils Relative: 0 %
Eosinophils Absolute: 0.1 10*3/uL (ref 0.0–0.5)
Eosinophils Relative: 1 %
HCT: 26 % — ABNORMAL LOW (ref 36.0–46.0)
Hemoglobin: 7.4 g/dL — ABNORMAL LOW (ref 12.0–15.0)
Immature Granulocytes: 1 %
Lymphocytes Relative: 41 %
Lymphs Abs: 3 10*3/uL (ref 0.7–4.0)
MCH: 19.3 pg — ABNORMAL LOW (ref 26.0–34.0)
MCHC: 28.5 g/dL — ABNORMAL LOW (ref 30.0–36.0)
MCV: 67.7 fL — ABNORMAL LOW (ref 80.0–100.0)
Monocytes Absolute: 0.5 10*3/uL (ref 0.1–1.0)
Monocytes Relative: 7 %
Neutro Abs: 3.6 10*3/uL (ref 1.7–7.7)
Neutrophils Relative %: 50 %
Platelet Count: 417 10*3/uL — ABNORMAL HIGH (ref 150–400)
RBC: 3.84 MIL/uL — ABNORMAL LOW (ref 3.87–5.11)
RDW: 20 % — ABNORMAL HIGH (ref 11.5–15.5)
WBC Count: 7.2 10*3/uL (ref 4.0–10.5)
nRBC: 0 % (ref 0.0–0.2)

## 2020-06-28 LAB — RETICULOCYTES
Immature Retic Fract: 30.2 % — ABNORMAL HIGH (ref 2.3–15.9)
RBC.: 3.85 MIL/uL — ABNORMAL LOW (ref 3.87–5.11)
Retic Count, Absolute: 93.6 10*3/uL (ref 19.0–186.0)
Retic Ct Pct: 2.4 % (ref 0.4–3.1)

## 2020-06-28 LAB — IRON AND TIBC
Iron: 12 ug/dL — ABNORMAL LOW (ref 41–142)
Saturation Ratios: 3 % — ABNORMAL LOW (ref 21–57)
TIBC: 346 ug/dL (ref 236–444)
UIBC: 335 ug/dL (ref 120–384)

## 2020-06-28 LAB — FERRITIN: Ferritin: 14 ng/mL (ref 11–307)

## 2020-06-28 NOTE — Progress Notes (Signed)
Hematology and Oncology Follow Up Visit  Tracey Copeland [id number] 1990/11/13 29 y.o. 06/28/2020   Principle Diagnosis:  Iron deficiency anemia secondary to heavy cycle Alpha thalassemia  Current Therapy: IV iron as indicated Folic acid 1 mg PO daily           Interim History:  Tracey Copeland is here today for follow-up. She is feeling fatigued, weak and craving ice.  She states that she has been on her cycle since 05/13/2020. She states that her bleeding is heavy and she needs to refill her Cyclafem and Lysteda.  She has not noted any other blood loss. No abnormal bruising. No petechiae.  She denies fever, chills, n/v, cough, rash, dizziness, SOB, chest pain, palpitations, abdominal pain or changes in bowel or bladder habits.  No swelling, tenderness, numbness or tingling in her extremities.  No falls or syncope.  She states that she has had minimal appetite. She does feel that she hydrates well throughout the day.   ECOG Performance Status: 1 - Symptomatic but completely ambulatory  Medications:  Allergies as of 06/28/2020      Reactions   Feraheme [ferumoxytol] Shortness Of Breath   Iron Anaphylaxis   Intravenous iron only   Onion Itching, Swelling, Rash      Medication List       Accurate as of June 28, 2020  9:37 AM. If you have any questions, ask your nurse or doctor.        albuterol 108 (90 Base) MCG/ACT inhaler Commonly known as: VENTOLIN HFA Inhale 2 puffs into the lungs every 6 (six) hours as needed for wheezing or shortness of breath.   EPINEPHrine 0.3 mg/0.3 mL Soaj injection Commonly known as: EPI-PEN Inject into the muscle.   ergocalciferol 1.25 MG (50000 UT) capsule Commonly known as: VITAMIN D2 Take 1 capsule (50,000 Units total) by mouth once a week.   ferrous sulfate 325 (65 FE) MG tablet Take 1 tablet (325 mg total) by mouth 2 (two) times daily with a meal.   folic acid 1 MG tablet Commonly known as: FOLVITE Take 1  tablet (1 mg total) by mouth daily.   norethindrone-ethinyl estradiol 0.5/0.75/1-35 MG-MCG tablet Commonly known as: CYCLAFEM Take 1 tablet by mouth daily.   norgestimate-ethinyl estradiol 0.25-35 MG-MCG tablet Commonly known as: ORTHO-CYCLEN Take 1 tablet by mouth daily.   ONE-A-DAY WOMENS PO Take 1 tablet by mouth daily.   tranexamic acid 650 MG Tabs tablet Commonly known as: LYSTEDA Take 650 mg by mouth 2 (two) times daily.       Allergies:  Allergies  Allergen Reactions   Feraheme [Ferumoxytol] Shortness Of Breath   Iron Anaphylaxis    Intravenous iron only   Onion Itching, Swelling and Rash    Past Medical History, Surgical history, Social history, and Family History were reviewed and updated.  Review of Systems: All other 10 point review of systems is negative.   Physical Exam:  height is 5\' 1"  (1.549 m). Her temperature is 98 F (36.7 C). Her blood pressure is 117/47 (abnormal) and her pulse is 85. Her respiration is 20 and oxygen saturation is 99%.   Wt Readings from Last 3 Encounters:  06/13/20 265 lb (120.2 kg)  04/25/20 234 lb (106.1 kg)  04/11/20 254 lb 6.4 oz (115.4 kg)    Ocular: Sclerae unicteric, pupils equal, round and reactive to light Ear-nose-throat: Oropharynx clear, dentition fair Lymphatic: No cervical or supraclavicular adenopathy Lungs no rales or rhonchi, good excursion bilaterally Heart regular rate and  rhythm, no murmur appreciated Abd soft, nontender, positive bowel sounds MSK no focal spinal tenderness, no joint edema Neuro: non-focal, well-oriented, appropriate affect Breasts: Deferred   Lab Results  Component Value Date   WBC 7.2 06/28/2020   HGB 7.4 (L) 06/28/2020   HCT 26.0 (L) 06/28/2020   MCV 67.7 (L) 06/28/2020   PLT 417 (H) 06/28/2020   Lab Results  Component Value Date   FERRITIN 39 04/11/2020   IRON 18 (L) 04/11/2020   TIBC 330 04/11/2020   UIBC 312 04/11/2020   IRONPCTSAT 5 (L) 04/11/2020   Lab Results   Component Value Date   RETICCTPCT 2.4 06/28/2020   RBC 3.84 (L) 06/28/2020   RBC 3.85 (L) 06/28/2020   No results found for: KPAFRELGTCHN, LAMBDASER, KAPLAMBRATIO No results found for: IGGSERUM, IGA, IGMSERUM No results found for: Odetta Pink, SPEI   Chemistry      Component Value Date/Time   NA 138 06/13/2020 0409   K 3.2 (L) 06/13/2020 0409   CL 104 06/13/2020 0409   CO2 23 06/13/2020 0409   BUN 10 06/13/2020 0409   CREATININE 0.70 06/13/2020 0409   CREATININE 0.77 09/12/2018 1300      Component Value Date/Time   CALCIUM 8.4 (L) 06/13/2020 0409   ALKPHOS 102 05/23/2019 0450   AST 13 (L) 05/23/2019 0450   AST 11 (L) 09/12/2018 1300   ALT 16 05/23/2019 0450   ALT 13 09/12/2018 1300   BILITOT 0.4 05/23/2019 0450   BILITOT 0.2 (L) 09/12/2018 1300       Impression and Plan: Tracey Copeland is a very pleasant29yo African American female with iron deficiency anemia secondary to heavy cycles.  She continues to have heavy vaginal bleeding.  Hgb is 7.4, MCV 67 and platelets 417.  Iron studies are pending. We will get her set up for replacement.  Follow-up in another 6 weeks.  She was encouraged to contact our office with any questions or concerns.   Laverna Peace, NP 12/21/20219:37 AM

## 2020-06-28 NOTE — Telephone Encounter (Signed)
Appointments scheduled patient has My Chart for appointment info per 12/21 los

## 2020-07-02 ENCOUNTER — Emergency Department (HOSPITAL_BASED_OUTPATIENT_CLINIC_OR_DEPARTMENT_OTHER)
Admission: EM | Admit: 2020-07-02 | Discharge: 2020-07-02 | Disposition: A | Discharge status: Home / Self Care | Payer: Medicaid Other | Attending: Emergency Medicine | Admitting: Emergency Medicine

## 2020-07-02 ENCOUNTER — Encounter (HOSPITAL_BASED_OUTPATIENT_CLINIC_OR_DEPARTMENT_OTHER): Payer: Self-pay | Admitting: Emergency Medicine

## 2020-07-02 ENCOUNTER — Other Ambulatory Visit: Payer: Self-pay

## 2020-07-02 DIAGNOSIS — J45909 Unspecified asthma, uncomplicated: Secondary | ICD-10-CM | POA: Insufficient documentation

## 2020-07-02 DIAGNOSIS — R531 Weakness: Secondary | ICD-10-CM | POA: Diagnosis present

## 2020-07-02 DIAGNOSIS — D649 Anemia, unspecified: Secondary | ICD-10-CM | POA: Insufficient documentation

## 2020-07-02 LAB — BASIC METABOLIC PANEL
Anion gap: 8 (ref 5–15)
BUN: 16 mg/dL (ref 6–20)
CO2: 26 mmol/L (ref 22–32)
Calcium: 8.7 mg/dL — ABNORMAL LOW (ref 8.9–10.3)
Chloride: 102 mmol/L (ref 98–111)
Creatinine, Ser: 1.05 mg/dL — ABNORMAL HIGH (ref 0.44–1.00)
GFR, Estimated: >60 mL/min (ref >60)
Glucose, Bld: 105 mg/dL — ABNORMAL HIGH (ref 70–99)
Potassium: 3.6 mmol/L (ref 3.5–5.1)
Sodium: 136 mmol/L (ref 135–145)

## 2020-07-02 LAB — URINALYSIS, ROUTINE W REFLEX MICROSCOPIC
Bilirubin Urine: NEGATIVE
Glucose, UA: NEGATIVE mg/dL
Ketones, ur: NEGATIVE mg/dL
Leukocytes,Ua: NEGATIVE
Nitrite: NEGATIVE
Protein, ur: NEGATIVE mg/dL
Specific Gravity, Urine: 1.02 (ref 1.005–1.030)
pH: 6.5 (ref 5.0–8.0)

## 2020-07-02 LAB — CBC
HCT: 24.9 % — ABNORMAL LOW (ref 36.0–46.0)
Hemoglobin: 7.1 g/dL — ABNORMAL LOW (ref 12.0–15.0)
MCH: 19 pg — ABNORMAL LOW (ref 26.0–34.0)
MCHC: 28.5 g/dL — ABNORMAL LOW (ref 30.0–36.0)
MCV: 66.8 fL — ABNORMAL LOW (ref 80.0–100.0)
Platelets: 408 10*3/uL — ABNORMAL HIGH (ref 150–400)
RBC: 3.73 MIL/uL — ABNORMAL LOW (ref 3.87–5.11)
RDW: 20.4 % — ABNORMAL HIGH (ref 11.5–15.5)
WBC: 6.6 10*3/uL (ref 4.0–10.5)
nRBC: 0 % (ref 0.0–0.2)

## 2020-07-02 LAB — URINALYSIS, MICROSCOPIC (REFLEX): WBC, UA: NONE SEEN WBC/hpf (ref 0–5)

## 2020-07-02 LAB — CBG MONITORING, ED: Glucose-Capillary: 99 mg/dL (ref 70–99)

## 2020-07-02 LAB — PREGNANCY, URINE: Preg Test, Ur: NEGATIVE

## 2020-07-02 NOTE — ED Triage Notes (Signed)
Pt has ongoing issues with vaginal bleeding. States she has had some dizziness today that concerns her. VS WNL.

## 2020-07-02 NOTE — ED Provider Notes (Signed)
[place] EMERGENCY DEPARTMENT Provider Note   CSN: [id number] Arrival date & time: 07/02/20  1959     History Chief Complaint  Patient presents with  . Near Syncope    Tracey Copeland is a 29 y.o. female.  29 year old female with complaint of generalized weakness, dizziness, shortness of breath, reports history of anemia due to ongoing vaginal bleeding since September, managed by hematology and gynecology. Patient reports using 2-3 pads today, no change from prior. Patient is scheduled to have an iron infusion on 07/05/20, was advised to be checked in the ER for any worsening symptoms.         Past Medical History:  Diagnosis Date  . Anemia   . Asthma   . Bipolar 1 disorder ([place])   . Blood transfusion without reported diagnosis   . Schizophrenia [facility])     Patient Active Problem List   Diagnosis Date Noted  . IDA (iron deficiency anemia) 09/11/2018  . Symptomatic anemia 08/24/2018  . Menorrhagia 03/23/2018  . Anemia 03/23/2018    Past Surgical History:  Procedure Laterality Date  . DILATION AND CURETTAGE OF UTERUS       OB History   No obstetric history on file.     No family history on file.  Social History   Tobacco Use  . Smoking status: Never Smoker  . Smokeless tobacco: Never Used  Vaping Use  . Vaping Use: Never used  Substance Use Topics  . Alcohol use: No  . Drug use: No    Home Medications Prior to Admission medications   Medication Sig Start Date End Date Taking? Authorizing Provider  albuterol (PROVENTIL HFA;VENTOLIN HFA) 108 (90 Base) MCG/ACT inhaler Inhale 2 puffs into the lungs every 6 (six) hours as needed for wheezing or shortness of breath.    [provider]  EPINEPHrine 0.3 mg/0.3 mL IJ SOAJ injection Inject into the muscle. 01/10/19   [provider]  ergocalciferol (VITAMIN D2) 1.25 MG (50000 UT) capsule Take 1 capsule (50,000 Units total) by mouth once a week. 09/23/18   [place], Holli Humbles, NP   ferrous sulfate 325 (65 FE) MG tablet Take 1 tablet (325 mg total) by mouth 2 (two) times daily with a meal. 02/06/20   [place], Gwenyth Allegra, MD  folic acid (FOLVITE) 1 MG tablet Take 1 tablet (1 mg total) by mouth daily. 02/29/20   [place], Holli Humbles, NP  Multiple Vitamins-Calcium (ONE-A-DAY WOMENS PO) Take 1 tablet by mouth daily.    [provider]  norethindrone-ethinyl estradiol (CYCLAFEM) 0.5/0.75/1-35 MG-MCG tablet Take 1 tablet by mouth daily.    [provider]  norgestimate-ethinyl estradiol (ORTHO-CYCLEN) 0.25-35 MG-MCG tablet Take 1 tablet by mouth daily. 03/31/19 04/11/20  [provider]  tranexamic acid (LYSTEDA) 650 MG TABS tablet Take 650 mg by mouth 2 (two) times daily.     [provider]    Allergies    Feraheme [ferumoxytol], Iron, and Onion  Review of Systems   Review of Systems  Constitutional: Positive for fatigue. Negative for fever.  Respiratory: Positive for shortness of breath.   Cardiovascular: Negative for chest pain.  Gastrointestinal: Negative for abdominal pain, constipation, diarrhea, nausea and vomiting.  Genitourinary: Positive for vaginal bleeding.  Musculoskeletal: Negative for arthralgias and myalgias.  Skin: Negative for rash and wound.  Allergic/Immunologic: Negative for immunocompromised state.  Neurological: Positive for dizziness and weakness.  Hematological: Does not bruise/bleed easily.  Psychiatric/Behavioral: Negative for confusion.  All other systems reviewed and are  negative.   Physical Exam Updated Vital Signs BP 118/70 (BP Location: Right Arm)   Pulse 89   Temp 98.6 F (37 C) (Oral)   Resp 19   Ht 5\' 1"  (1.549 m)   Wt 116.6 kg   SpO2 97%   BMI 48.56 kg/m   Physical Exam Vitals and nursing note reviewed.  Constitutional:      General: She is not in acute distress.    Appearance: She is well-developed and well-nourished. She is not diaphoretic.  HENT:     Head: Normocephalic and  atraumatic.  Cardiovascular:     Rate and Rhythm: Normal rate and regular rhythm.     Pulses: Normal pulses.     Heart sounds: Normal heart sounds.  Pulmonary:     Effort: Pulmonary effort is normal.     Breath sounds: Normal breath sounds.  Abdominal:     Palpations: Abdomen is soft.     Tenderness: There is no abdominal tenderness.  Skin:    General: Skin is warm and dry.     Coloration: Skin is not pale.     Findings: No erythema or rash.  Neurological:     Mental Status: She is alert and oriented to person, place, and time.  Psychiatric:        Mood and Affect: Mood and affect normal.        Behavior: Behavior normal.     ED Results / Procedures / Treatments   Labs (all labs ordered are listed, but only abnormal results are displayed) Labs Reviewed  BASIC METABOLIC PANEL - Abnormal; Notable for the following components:      Result Value   Glucose, Bld 105 (*)    Creatinine, Ser 1.05 (*)    Calcium 8.7 (*)    All other components within normal limits  CBC - Abnormal; Notable for the following components:   RBC 3.73 (*)    Hemoglobin 7.1 (*)    HCT 24.9 (*)    MCV 66.8 (*)    MCH 19.0 (*)    MCHC 28.5 (*)    RDW 20.4 (*)    Platelets 408 (*)    All other components within normal limits  URINALYSIS, ROUTINE W REFLEX MICROSCOPIC - Abnormal; Notable for the following components:   Hgb urine dipstick LARGE (*)    All other components within normal limits  URINALYSIS, MICROSCOPIC (REFLEX) - Abnormal; Notable for the following components:   Bacteria, UA RARE (*)    All other components within normal limits  PREGNANCY, URINE  CBG MONITORING, ED    EKG EKG Interpretation  Date/Time:  Saturday July 02 2020 20:14:00 EST Ventricular Rate:  92 PR Interval:  162 QRS Duration: 76 QT Interval:  350 QTC Calculation: 432 R Axis:   58 Text Interpretation: Normal sinus rhythm Normal ECG No significant change since last tracing Confirmed by Wandra Arthurs [id number]) on  07/02/2020 8:56:14 PM   Radiology No results found.  Procedures Procedures (including critical care time)  Medications Ordered in ED Medications - No data to display  ED Course  I have reviewed the triage vital signs and the nursing notes.  Pertinent labs & imaging results that were available during my care of the patient were reviewed by me and considered in my medical decision making (see chart for details).  Clinical Course as of 07/02/20 2132  Sat Dec 25, 925  3028 29 year old female with complaint as above. On exam, patient is well appearing, vitals  are stable. No change in her report of bleeding. Hgb is 7.1 today, previously 7.4, generally in the 7-8 range. Without significant change from prior hgb, I do not feel patient needs emergent transfusion in the ER tonight. Advised her to follow up with her hematologist to discuss upcoming infusion and consideration for possible transfusion at that time. Return to ER for worsening or concerning symptoms.  [LM]    Clinical Course User Index [LM] Roque Lias   MDM Rules/Calculators/A&P                          Final Clinical Impression(s) / ED Diagnoses Final diagnoses:  Chronic anemia    Rx / DC Orders ED Discharge Orders    None       Roque Lias 07/02/20 2132    Drenda Freeze, MD 07/02/20 [phone]

## 2020-07-02 NOTE — Discharge Instructions (Addendum)
Follow up with your hematologist and gynecologist as discussed.

## 2020-07-04 ENCOUNTER — Encounter: Payer: Self-pay | Admitting: *Deleted

## 2020-07-05 ENCOUNTER — Inpatient Hospital Stay: Payer: Medicaid Other

## 2020-07-05 ENCOUNTER — Other Ambulatory Visit: Payer: Self-pay

## 2020-07-05 VITALS — BP 108/43 | HR 80 | Temp 97.9°F | Resp 16

## 2020-07-05 DIAGNOSIS — D500 Iron deficiency anemia secondary to blood loss (chronic): Secondary | ICD-10-CM | POA: Diagnosis not present

## 2020-07-05 DIAGNOSIS — N921 Excessive and frequent menstruation with irregular cycle: Secondary | ICD-10-CM

## 2020-07-05 MED ORDER — FAMOTIDINE IN NACL 20-0.9 MG/50ML-% IV SOLN
20.0000 mg | Freq: Once | INTRAVENOUS | Status: AC
  Administered 2020-07-05: 20 mg via INTRAVENOUS

## 2020-07-05 MED ORDER — METHYLPREDNISOLONE SODIUM SUCC 125 MG IJ SOLR
80.0000 mg | Freq: Once | INTRAMUSCULAR | Status: AC
  Administered 2020-07-05: 80 mg via INTRAVENOUS

## 2020-07-05 MED ORDER — SODIUM CHLORIDE 0.9 % IV SOLN
200.0000 mg | Freq: Once | INTRAVENOUS | Status: AC
  Administered 2020-07-05: 200 mg via INTRAVENOUS
  Filled 2020-07-05: qty 200

## 2020-07-05 MED ORDER — SODIUM CHLORIDE 0.9 % IV SOLN
Freq: Once | INTRAVENOUS | Status: AC
  Filled 2020-07-05: qty 250

## 2020-07-05 MED ORDER — METHYLPREDNISOLONE SODIUM SUCC 125 MG IJ SOLR
INTRAMUSCULAR | Status: AC
  Filled 2020-07-05: qty 2

## 2020-07-05 NOTE — Patient Instructions (Signed)

## 2020-07-05 NOTE — Progress Notes (Signed)
Pt discharged in no apparent distress. Pt left ambulatory without assistance. Pt aware of discharge instructions and verbalized understanding and had no further questions.  

## 2020-07-12 ENCOUNTER — Inpatient Hospital Stay: Payer: Medicaid Other | Attending: Family

## 2020-07-12 ENCOUNTER — Telehealth: Payer: Self-pay

## 2020-07-12 DIAGNOSIS — D500 Iron deficiency anemia secondary to blood loss (chronic): Secondary | ICD-10-CM | POA: Insufficient documentation

## 2020-07-12 DIAGNOSIS — N920 Excessive and frequent menstruation with regular cycle: Secondary | ICD-10-CM | POA: Insufficient documentation

## 2020-07-12 NOTE — Telephone Encounter (Signed)
returned pts call as she over slept and missed her iron tx, appts have been added to end of cycle and pt is aware   aom

## 2020-07-15 ENCOUNTER — Inpatient Hospital Stay: Payer: Medicaid Other

## 2020-07-15 ENCOUNTER — Other Ambulatory Visit: Payer: Self-pay

## 2020-07-15 VITALS — BP 116/56 | HR 69 | Temp 97.9°F | Resp 16

## 2020-07-15 DIAGNOSIS — N920 Excessive and frequent menstruation with regular cycle: Secondary | ICD-10-CM | POA: Diagnosis present

## 2020-07-15 DIAGNOSIS — N921 Excessive and frequent menstruation with irregular cycle: Secondary | ICD-10-CM

## 2020-07-15 DIAGNOSIS — D500 Iron deficiency anemia secondary to blood loss (chronic): Secondary | ICD-10-CM

## 2020-07-15 MED ORDER — FAMOTIDINE IN NACL 20-0.9 MG/50ML-% IV SOLN
20.0000 mg | Freq: Once | INTRAVENOUS | Status: AC
  Administered 2020-07-15: 20 mg via INTRAVENOUS

## 2020-07-15 MED ORDER — SODIUM CHLORIDE 0.9% FLUSH
3.0000 mL | Freq: Once | INTRAVENOUS | Status: DC | PRN
  Filled 2020-07-15: qty 10

## 2020-07-15 MED ORDER — FAMOTIDINE IN NACL 20-0.9 MG/50ML-% IV SOLN
INTRAVENOUS | Status: AC
  Filled 2020-07-15: qty 50

## 2020-07-15 MED ORDER — SODIUM CHLORIDE 0.9 % IV SOLN
Freq: Once | INTRAVENOUS | Status: AC
  Filled 2020-07-15: qty 250

## 2020-07-15 MED ORDER — SODIUM CHLORIDE 0.9% FLUSH
10.0000 mL | Freq: Once | INTRAVENOUS | Status: DC | PRN
  Filled 2020-07-15: qty 10

## 2020-07-15 MED ORDER — METHYLPREDNISOLONE SODIUM SUCC 125 MG IJ SOLR
INTRAMUSCULAR | Status: AC
  Filled 2020-07-15: qty 2

## 2020-07-15 MED ORDER — METHYLPREDNISOLONE SODIUM SUCC 125 MG IJ SOLR
80.0000 mg | Freq: Once | INTRAMUSCULAR | Status: AC
  Administered 2020-07-15: 80 mg via INTRAVENOUS

## 2020-07-15 MED ORDER — SODIUM CHLORIDE 0.9 % IV SOLN
200.0000 mg | Freq: Once | INTRAVENOUS | Status: AC
  Administered 2020-07-15: 200 mg via INTRAVENOUS
  Filled 2020-07-15: qty 200

## 2020-07-15 NOTE — Progress Notes (Signed)
Pt discharged in no apparent distress. Pt left ambulatory without assistance. Pt aware of discharge instructions and verbalized understanding and had no further questions.  

## 2020-07-15 NOTE — Patient Instructions (Signed)

## 2020-07-18 ENCOUNTER — Encounter (HOSPITAL_BASED_OUTPATIENT_CLINIC_OR_DEPARTMENT_OTHER): Payer: Self-pay | Admitting: *Deleted

## 2020-07-18 ENCOUNTER — Other Ambulatory Visit: Payer: Self-pay

## 2020-07-18 ENCOUNTER — Telehealth: Payer: Self-pay | Admitting: Family

## 2020-07-18 ENCOUNTER — Telehealth: Payer: Self-pay | Admitting: *Deleted

## 2020-07-18 ENCOUNTER — Inpatient Hospital Stay: Payer: Medicaid Other

## 2020-07-18 ENCOUNTER — Emergency Department (HOSPITAL_BASED_OUTPATIENT_CLINIC_OR_DEPARTMENT_OTHER)
Admission: EM | Admit: 2020-07-18 | Discharge: 2020-07-18 | Disposition: A | Discharge status: Home / Self Care | Payer: Medicaid Other | Attending: Emergency Medicine | Admitting: Emergency Medicine

## 2020-07-18 DIAGNOSIS — R000 Tachycardia, unspecified: Secondary | ICD-10-CM | POA: Insufficient documentation

## 2020-07-18 DIAGNOSIS — J020 Streptococcal pharyngitis: Secondary | ICD-10-CM | POA: Diagnosis not present

## 2020-07-18 DIAGNOSIS — J45909 Unspecified asthma, uncomplicated: Secondary | ICD-10-CM | POA: Diagnosis not present

## 2020-07-18 DIAGNOSIS — R509 Fever, unspecified: Secondary | ICD-10-CM | POA: Diagnosis present

## 2020-07-18 MED ORDER — PENICILLIN G BENZATHINE 1200000 UNIT/2ML IM SUSP
1.2000 10*6.[IU] | Freq: Once | INTRAMUSCULAR | Status: AC
  Administered 2020-07-18: 1.2 10*6.[IU] via INTRAMUSCULAR
  Filled 2020-07-18: qty 2

## 2020-07-18 MED ORDER — ACETAMINOPHEN 325 MG PO TABS
650.0000 mg | ORAL_TABLET | Freq: Once | ORAL | Status: AC
  Administered 2020-07-18: 650 mg via ORAL
  Filled 2020-07-18: qty 2

## 2020-07-18 NOTE — Telephone Encounter (Signed)
Message received from patient stating that she was in the ER this morning, was diagnosed with strep and needs iron infusion appts rescheduled for today.  Message sent to scheduling.

## 2020-07-18 NOTE — ED Triage Notes (Signed)
Pt concerned she is having fevers, chills, headache, and sore throat.  Child was diagnosed with strep throat yesterday. C/o of feeling sob with exertion. Pt took elderberry at 0300. No other meds taken.

## 2020-07-18 NOTE — Telephone Encounter (Signed)
Called and LMVM for patient with new appointment dates & time . Per 1/10 sch msg

## 2020-07-18 NOTE — Discharge Instructions (Addendum)
Follow-up on your son's COVID test also.

## 2020-07-18 NOTE — ED Provider Notes (Signed)
[place] EMERGENCY DEPARTMENT Provider Note   CSN: [id number] Arrival date & time: 07/18/20  0160     History Chief Complaint  Patient presents with  . Fever    Tracey Copeland is a 30 y.o. female.  HPI Patient presents with fever chills headache and sore throat.  Patient's son was seen for similar symptoms yesterday or the day before.  Had positive strep test.  Patient states she was called with a negative COVID test for him but reviewing records does not appear that any call was document is made in the COVID test is actually not resulted yet.  Has had a cough with some mild sputum production.  No abdominal pain.    Past Medical History:  Diagnosis Date  . Anemia   . Asthma   . Bipolar 1 disorder ([place])   . Blood transfusion without reported diagnosis   . Schizophrenia [facility])     Patient Active Problem List   Diagnosis Date Noted  . IDA (iron deficiency anemia) 09/11/2018  . Symptomatic anemia 08/24/2018  . Menorrhagia 03/23/2018  . Anemia 03/23/2018    Past Surgical History:  Procedure Laterality Date  . DILATION AND CURETTAGE OF UTERUS       OB History   No obstetric history on file.     No family history on file.  Social History   Tobacco Use  . Smoking status: Never Smoker  . Smokeless tobacco: Never Used  Vaping Use  . Vaping Use: Never used  Substance Use Topics  . Alcohol use: No  . Drug use: No    Home Medications Prior to Admission medications   Medication Sig Start Date End Date Taking? Authorizing Provider  albuterol (PROVENTIL HFA;VENTOLIN HFA) 108 (90 Base) MCG/ACT inhaler Inhale 2 puffs into the lungs every 6 (six) hours as needed for wheezing or shortness of breath.    [provider]  EPINEPHrine 0.3 mg/0.3 mL IJ SOAJ injection Inject into the muscle. 01/10/19   [provider]  ergocalciferol (VITAMIN D2) 1.25 MG (50000 UT) capsule Take 1 capsule (50,000 Units total) by mouth once a week. 09/23/18    [place], Holli Humbles, NP  ferrous sulfate 325 (65 FE) MG tablet Take 1 tablet (325 mg total) by mouth 2 (two) times daily with a meal. 02/06/20   [place], Gwenyth Allegra, MD  folic acid (FOLVITE) 1 MG tablet Take 1 tablet (1 mg total) by mouth daily. 02/29/20   [place], Holli Humbles, NP  Multiple Vitamins-Calcium (ONE-A-DAY WOMENS PO) Take 1 tablet by mouth daily.    [provider]  norethindrone-ethinyl estradiol (CYCLAFEM) 0.5/0.75/1-35 MG-MCG tablet Take 1 tablet by mouth daily.    [provider]  norgestimate-ethinyl estradiol (ORTHO-CYCLEN) 0.25-35 MG-MCG tablet Take 1 tablet by mouth daily. 03/31/19 04/11/20  [provider]  tranexamic acid (LYSTEDA) 650 MG TABS tablet Take 650 mg by mouth 2 (two) times daily.     [provider]    Allergies    Feraheme [ferumoxytol], Iron, and Onion  Review of Systems   Review of Systems  Constitutional: Positive for appetite change and fever.  HENT: Positive for congestion and sore throat.   Eyes: Negative for photophobia.  Respiratory: Positive for cough and shortness of breath.   Cardiovascular: Negative for leg swelling.  Gastrointestinal: Negative for abdominal pain.  Genitourinary: Negative for flank pain.  Musculoskeletal: Negative for back pain.  Skin: Negative for rash.  Neurological: Positive for headaches.  Psychiatric/Behavioral: Negative for confusion.  Physical Exam Updated Vital Signs BP 113/70   Pulse 100   Temp (!) 100.6 F (38.1 C) (Oral)   Resp 20   Ht 5\' 1"  (1.549 m)   Wt 117.9 kg   LMP 03/20/2020   SpO2 98%   BMI 49.13 kg/m   Physical Exam Vitals and nursing note reviewed.  HENT:     Head: Normocephalic.     Right Ear: External ear normal.     Left Ear: External ear normal.     Mouth/Throat:     Pharynx: Posterior oropharyngeal erythema present. No oropharyngeal exudate.  Eyes:     Pupils: Pupils are equal, round, and reactive to light.  Cardiovascular:     Rate and  Rhythm: Tachycardia present.  Pulmonary:     Comments: No focal rales or rhonchi.  Occasional cough. Abdominal:     Tenderness: There is no abdominal tenderness.  Musculoskeletal:        General: No tenderness.     Cervical back: No rigidity.     Right lower leg: No edema.     Left lower leg: No edema.  Skin:    General: Skin is warm.  Neurological:     Mental Status: She is alert and oriented to person, place, and time.     ED Results / Procedures / Treatments   Labs (all labs ordered are listed, but only abnormal results are displayed) Labs Reviewed - No data to display  EKG None  Radiology No results found.  Procedures Procedures (including critical care time)  Medications Ordered in ED Medications  penicillin g benzathine (BICILLIN LA) 1200000 UNIT/2ML injection 1.2 Million Units (1.2 Million Units Intramuscular Given 07/18/20 0452)  acetaminophen (TYLENOL) tablet 650 mg (650 mg Oral Given 07/18/20 4193)    ED Course  I have reviewed the triage vital signs and the nursing notes.  Pertinent labs & imaging results that were available during my care of the patient were reviewed by me and considered in my medical decision making (see chart for details).    MDM Rules/Calculators/A&P                          Patient with sore throat fevers chills and cough.  Patient's son was positive for strep.  Records reviewed.  However COVID does not come back.  With this I had ordered a COVID test on the patient but she refused it.  She states she will just check her son's test after it comes back after 8:00 this morning.  Discussed with patient and IM penicillin given.  Does not appear severely septic at this time.  Discharge home. Final Clinical Impression(s) / ED Diagnoses Final diagnoses:  Strep pharyngitis    Rx / DC Orders ED Discharge Orders    None       Davonna Belling, MD 07/18/20 [phone]

## 2020-07-18 NOTE — ED Notes (Signed)
Pt declines covid testing at this time.

## 2020-07-20 ENCOUNTER — Inpatient Hospital Stay: Payer: Medicaid Other

## 2020-07-22 ENCOUNTER — Ambulatory Visit: Payer: Medicaid Other

## 2020-07-27 ENCOUNTER — Inpatient Hospital Stay: Payer: Medicaid Other

## 2020-07-29 ENCOUNTER — Inpatient Hospital Stay: Payer: Medicaid Other

## 2020-08-01 ENCOUNTER — Inpatient Hospital Stay: Payer: Medicaid Other

## 2020-08-08 ENCOUNTER — Other Ambulatory Visit: Payer: Self-pay

## 2020-08-08 ENCOUNTER — Inpatient Hospital Stay: Payer: Medicaid Other

## 2020-08-08 VITALS — BP 111/70 | HR 88 | Temp 98.1°F | Resp 18

## 2020-08-08 DIAGNOSIS — D500 Iron deficiency anemia secondary to blood loss (chronic): Secondary | ICD-10-CM

## 2020-08-08 DIAGNOSIS — N921 Excessive and frequent menstruation with irregular cycle: Secondary | ICD-10-CM

## 2020-08-08 MED ORDER — SODIUM CHLORIDE 0.9 % IV SOLN
Freq: Once | INTRAVENOUS | Status: AC
  Filled 2020-08-08: qty 250

## 2020-08-08 MED ORDER — METHYLPREDNISOLONE SODIUM SUCC 125 MG IJ SOLR
80.0000 mg | Freq: Once | INTRAMUSCULAR | Status: AC
  Administered 2020-08-08: 80 mg via INTRAVENOUS

## 2020-08-08 MED ORDER — FAMOTIDINE IN NACL 20-0.9 MG/50ML-% IV SOLN
INTRAVENOUS | Status: AC
  Filled 2020-08-08: qty 50

## 2020-08-08 MED ORDER — METHYLPREDNISOLONE SODIUM SUCC 125 MG IJ SOLR
INTRAMUSCULAR | Status: AC
  Filled 2020-08-08: qty 2

## 2020-08-08 MED ORDER — FAMOTIDINE IN NACL 20-0.9 MG/50ML-% IV SOLN
20.0000 mg | Freq: Once | INTRAVENOUS | Status: AC
  Administered 2020-08-08: 20 mg via INTRAVENOUS

## 2020-08-08 MED ORDER — SODIUM CHLORIDE 0.9 % IV SOLN
200.0000 mg | Freq: Once | INTRAVENOUS | Status: AC
  Administered 2020-08-08: 200 mg via INTRAVENOUS
  Filled 2020-08-08: qty 200

## 2020-08-08 NOTE — Patient Instructions (Signed)

## 2020-08-09 ENCOUNTER — Other Ambulatory Visit: Payer: Medicaid Other

## 2020-08-09 ENCOUNTER — Ambulatory Visit: Payer: Medicaid Other | Admitting: Family

## 2020-08-10 ENCOUNTER — Telehealth: Payer: Self-pay

## 2020-08-10 ENCOUNTER — Inpatient Hospital Stay: Payer: Medicaid Other

## 2020-08-10 NOTE — Telephone Encounter (Signed)
Returned pts call as she over slept and was going to miss her 1:30 iron tx today, pt r/s for 08/15/20

## 2020-08-12 ENCOUNTER — Inpatient Hospital Stay: Payer: Medicaid Other | Attending: Hematology & Oncology

## 2020-08-15 ENCOUNTER — Inpatient Hospital Stay: Payer: Medicaid Other

## 2020-08-15 ENCOUNTER — Telehealth: Payer: Self-pay

## 2020-08-15 NOTE — Telephone Encounter (Signed)
Pt states that she had been in the hosp and that she needed to r/s todays iron appt, pt has r/s to 08/19/20    Tracey Copeland

## 2020-08-17 ENCOUNTER — Inpatient Hospital Stay: Payer: Medicaid Other

## 2020-08-17 ENCOUNTER — Inpatient Hospital Stay: Payer: Medicaid Other | Admitting: Family

## 2020-08-19 ENCOUNTER — Inpatient Hospital Stay: Payer: Medicaid Other

## 2020-12-22 ENCOUNTER — Inpatient Hospital Stay (HOSPITAL_BASED_OUTPATIENT_CLINIC_OR_DEPARTMENT_OTHER)
Admission: EM | Admit: 2020-12-22 | Discharge: 2020-12-24 | DRG: 418 | Disposition: A | Discharge status: Home / Self Care | Payer: Medicaid Other | Attending: General Surgery | Admitting: General Surgery

## 2020-12-22 ENCOUNTER — Emergency Department (HOSPITAL_BASED_OUTPATIENT_CLINIC_OR_DEPARTMENT_OTHER): Payer: Medicaid Other

## 2020-12-22 ENCOUNTER — Encounter (HOSPITAL_BASED_OUTPATIENT_CLINIC_OR_DEPARTMENT_OTHER): Payer: Self-pay | Admitting: Emergency Medicine

## 2020-12-22 ENCOUNTER — Other Ambulatory Visit: Payer: Self-pay

## 2020-12-22 DIAGNOSIS — J45909 Unspecified asthma, uncomplicated: Secondary | ICD-10-CM | POA: Diagnosis present

## 2020-12-22 DIAGNOSIS — Z79891 Long term (current) use of opiate analgesic: Secondary | ICD-10-CM | POA: Diagnosis not present

## 2020-12-22 DIAGNOSIS — D509 Iron deficiency anemia, unspecified: Secondary | ICD-10-CM | POA: Diagnosis present

## 2020-12-22 DIAGNOSIS — K851 Biliary acute pancreatitis without necrosis or infection: Secondary | ICD-10-CM | POA: Diagnosis present

## 2020-12-22 DIAGNOSIS — K8010 Calculus of gallbladder with chronic cholecystitis without obstruction: Secondary | ICD-10-CM | POA: Diagnosis not present

## 2020-12-22 DIAGNOSIS — Z20822 Contact with and (suspected) exposure to covid-19: Secondary | ICD-10-CM | POA: Diagnosis not present

## 2020-12-22 DIAGNOSIS — E876 Hypokalemia: Secondary | ICD-10-CM | POA: Diagnosis not present

## 2020-12-22 DIAGNOSIS — Z91018 Allergy to other foods: Secondary | ICD-10-CM

## 2020-12-22 DIAGNOSIS — Z6841 Body Mass Index (BMI) 40.0 and over, adult: Secondary | ICD-10-CM

## 2020-12-22 DIAGNOSIS — Z888 Allergy status to other drugs, medicaments and biological substances status: Secondary | ICD-10-CM

## 2020-12-22 DIAGNOSIS — Z419 Encounter for procedure for purposes other than remedying health state, unspecified: Secondary | ICD-10-CM

## 2020-12-22 DIAGNOSIS — K8020 Calculus of gallbladder without cholecystitis without obstruction: Secondary | ICD-10-CM

## 2020-12-22 DIAGNOSIS — Z79899 Other long term (current) drug therapy: Secondary | ICD-10-CM

## 2020-12-22 DIAGNOSIS — E669 Obesity, unspecified: Secondary | ICD-10-CM | POA: Diagnosis not present

## 2020-12-22 DIAGNOSIS — F319 Bipolar disorder, unspecified: Secondary | ICD-10-CM | POA: Diagnosis not present

## 2020-12-22 LAB — LIPASE, BLOOD: Lipase: 450 U/L — ABNORMAL HIGH (ref 11–51)

## 2020-12-22 LAB — CBC WITH DIFFERENTIAL/PLATELET
Abs Immature Granulocytes: 0.03 10*3/uL (ref 0.00–0.07)
Basophils Absolute: 0 10*3/uL (ref 0.0–0.1)
Basophils Relative: 0 %
Eosinophils Absolute: 0.1 10*3/uL (ref 0.0–0.5)
Eosinophils Relative: 1 %
HCT: 29.6 % — ABNORMAL LOW (ref 36.0–46.0)
Hemoglobin: 8.8 g/dL — ABNORMAL LOW (ref 12.0–15.0)
Immature Granulocytes: 0 %
Lymphocytes Relative: 31 %
Lymphs Abs: 3.1 10*3/uL (ref 0.7–4.0)
MCH: 19.9 pg — ABNORMAL LOW (ref 26.0–34.0)
MCHC: 29.7 g/dL — ABNORMAL LOW (ref 30.0–36.0)
MCV: 66.8 fL — ABNORMAL LOW (ref 80.0–100.0)
Monocytes Absolute: 0.6 10*3/uL (ref 0.1–1.0)
Monocytes Relative: 6 %
Neutro Abs: 6.3 10*3/uL (ref 1.7–7.7)
Neutrophils Relative %: 62 %
Platelets: 450 10*3/uL — ABNORMAL HIGH (ref 150–400)
RBC: 4.43 MIL/uL (ref 3.87–5.11)
RDW: 18.1 % — ABNORMAL HIGH (ref 11.5–15.5)
WBC: 10.1 10*3/uL (ref 4.0–10.5)
nRBC: 0 % (ref 0.0–0.2)

## 2020-12-22 LAB — RESP PANEL BY RT-PCR (FLU A&B, COVID) ARPGX2
Influenza A by PCR: NEGATIVE
Influenza B by PCR: NEGATIVE
SARS Coronavirus 2 by RT PCR: NEGATIVE

## 2020-12-22 LAB — COMPREHENSIVE METABOLIC PANEL
ALT: 17 U/L (ref 0–44)
AST: 22 U/L (ref 15–41)
Albumin: 3.6 g/dL (ref 3.5–5.0)
Alkaline Phosphatase: 124 U/L (ref 38–126)
Anion gap: 9 (ref 5–15)
BUN: 11 mg/dL (ref 6–20)
CO2: 27 mmol/L (ref 22–32)
Calcium: 8.8 mg/dL — ABNORMAL LOW (ref 8.9–10.3)
Chloride: 102 mmol/L (ref 98–111)
Creatinine, Ser: 0.92 mg/dL (ref 0.44–1.00)
GFR, Estimated: >60 mL/min (ref >60)
Glucose, Bld: 140 mg/dL — ABNORMAL HIGH (ref 70–99)
Potassium: 3 mmol/L — ABNORMAL LOW (ref 3.5–5.1)
Sodium: 138 mmol/L (ref 135–145)
Total Bilirubin: 0.2 mg/dL — ABNORMAL LOW (ref 0.3–1.2)
Total Protein: 7.6 g/dL (ref 6.5–8.1)

## 2020-12-22 LAB — PREGNANCY, URINE: Preg Test, Ur: NEGATIVE

## 2020-12-22 LAB — MRSA PCR SCREENING: MRSA by PCR: NEGATIVE

## 2020-12-22 MED ORDER — POTASSIUM CHLORIDE CRYS ER 20 MEQ PO TBCR
40.0000 meq | EXTENDED_RELEASE_TABLET | Freq: Once | ORAL | Status: AC
  Administered 2020-12-22: 40 meq via ORAL
  Filled 2020-12-22: qty 2

## 2020-12-22 MED ORDER — HYDROMORPHONE HCL 1 MG/ML IJ SOLN
1.0000 mg | Freq: Once | INTRAMUSCULAR | Status: AC
  Administered 2020-12-22: 1 mg via INTRAVENOUS
  Filled 2020-12-22: qty 1

## 2020-12-22 MED ORDER — OXYCODONE HCL 5 MG PO TABS
5.0000 mg | ORAL_TABLET | ORAL | Status: DC | PRN
  Administered 2020-12-23 – 2020-12-24 (×2): 5 mg via ORAL
  Filled 2020-12-22 (×2): qty 1

## 2020-12-22 MED ORDER — ALBUTEROL SULFATE HFA 108 (90 BASE) MCG/ACT IN AERS: 2.0000 | INHALATION_SPRAY | Freq: Four times a day (QID) | RESPIRATORY_TRACT | Status: DC | PRN

## 2020-12-22 MED ORDER — ACETAMINOPHEN 500 MG PO TABS: 1000.0000 mg | ORAL_TABLET | Freq: Four times a day (QID) | ORAL | Status: DC | PRN

## 2020-12-22 MED ORDER — ONDANSETRON HCL 4 MG/2ML IJ SOLN: 4.0000 mg | Freq: Four times a day (QID) | INTRAMUSCULAR | Status: DC | PRN

## 2020-12-22 MED ORDER — ALBUTEROL SULFATE (2.5 MG/3ML) 0.083% IN NEBU: 2.5000 mg | INHALATION_SOLUTION | Freq: Four times a day (QID) | RESPIRATORY_TRACT | Status: DC | PRN

## 2020-12-22 MED ORDER — HYDRALAZINE HCL 20 MG/ML IJ SOLN: 10.0000 mg | INTRAMUSCULAR | Status: DC | PRN

## 2020-12-22 MED ORDER — ENOXAPARIN SODIUM 40 MG/0.4ML IJ SOSY
40.0000 mg | PREFILLED_SYRINGE | INTRAMUSCULAR | Status: DC
  Administered 2020-12-22: 40 mg via SUBCUTANEOUS
  Filled 2020-12-22: qty 0.4

## 2020-12-22 MED ORDER — ONDANSETRON HCL 4 MG/2ML IJ SOLN
4.0000 mg | Freq: Once | INTRAMUSCULAR | Status: AC
  Administered 2020-12-22: 4 mg via INTRAVENOUS
  Filled 2020-12-22: qty 2

## 2020-12-22 MED ORDER — DIPHENHYDRAMINE HCL 50 MG/ML IJ SOLN: 25.0000 mg | Freq: Four times a day (QID) | INTRAMUSCULAR | Status: DC | PRN

## 2020-12-22 MED ORDER — FERROUS SULFATE 325 (65 FE) MG PO TABS
325.0000 mg | ORAL_TABLET | Freq: Two times a day (BID) | ORAL | Status: DC
  Administered 2020-12-23 – 2020-12-24 (×2): 325 mg via ORAL
  Filled 2020-12-22 (×3): qty 1

## 2020-12-22 MED ORDER — IOHEXOL 300 MG/ML  SOLN
100.0000 mL | Freq: Once | INTRAMUSCULAR | Status: AC | PRN
  Administered 2020-12-22: 100 mL via INTRAVENOUS

## 2020-12-22 MED ORDER — SODIUM CHLORIDE 0.9 % IV BOLUS
1000.0000 mL | Freq: Once | INTRAVENOUS | Status: AC
  Administered 2020-12-22: 1000 mL via INTRAVENOUS

## 2020-12-22 MED ORDER — SIMETHICONE 80 MG PO CHEW
40.0000 mg | CHEWABLE_TABLET | Freq: Four times a day (QID) | ORAL | Status: DC | PRN
  Administered 2020-12-24: 40 mg via ORAL
  Filled 2020-12-22: qty 1

## 2020-12-22 MED ORDER — HYDROMORPHONE HCL 1 MG/ML IJ SOLN: 0.5000 mg | INTRAMUSCULAR | Status: DC | PRN

## 2020-12-22 MED ORDER — KCL IN DEXTROSE-NACL 20-5-0.45 MEQ/L-%-% IV SOLN
INTRAVENOUS | Status: DC
  Filled 2020-12-22 (×3): qty 1000

## 2020-12-22 MED ORDER — ONDANSETRON 4 MG PO TBDP: 4.0000 mg | ORAL_TABLET | Freq: Four times a day (QID) | ORAL | Status: DC | PRN

## 2020-12-22 MED ORDER — HYDROMORPHONE HCL 1 MG/ML IJ SOLN
INTRAMUSCULAR | Status: AC
  Filled 2020-12-22: qty 1

## 2020-12-22 MED ORDER — DIPHENHYDRAMINE HCL 25 MG PO CAPS: 25.0000 mg | ORAL_CAPSULE | Freq: Four times a day (QID) | ORAL | Status: DC | PRN

## 2020-12-22 NOTE — ED Provider Notes (Addendum)
Seen as transfer from [facility] ED for surgery consultation.  History of cholelithiasis, was planning for elective cholecystectomy, however presented to the ED for worsening epigastric abdominal pain with nausea vomiting.  CT was negative, lipase is elevated to 450.  Dr. Ninfa Linden was consulted, to evaluate here in the ED for disposition.  On evaluation, patient states her nausea and pain are returning.  Redosed nausea and pain medication. General surgery made aware of patient's arrival, spoke with PA Claiborne Billings.      Patient admitted for IVF and pain control.   Jayce Kainz, [place] N, PA-C 12/22/20 1141    Ranier Coach, [place] N, PA-C 12/22/20 1427    Isla Pence, MD 12/22/20 1436

## 2020-12-22 NOTE — ED Triage Notes (Signed)
Patient presents with complaints of epigastric pain and nausea and vomiting; states recently diagnosed with gallstones.

## 2020-12-22 NOTE — ED Notes (Signed)
Patient transported to CT 

## 2020-12-22 NOTE — ED Provider Notes (Signed)
Patient care assumed at 0700.  Pt with hx/o cholelithiasis here with epigastric pain, N/V since 0400.  Lipase elevated, CT pending.    Pt's pain is improved but not fully resolved after pain meds.    CT is negative for acute abnormality. Patient does have ongoing pain but declines additional medications at this time. Discussed with Dr. Ninfa Linden with general surgery at [facility], recommends ED to ED transfer.  discussed with Dr. Gilford Raid in the [facility] emergency department, who accepts the patient in transfer.   Quintella Reichert, MD 12/22/20 [phone]

## 2020-12-22 NOTE — ED Notes (Signed)
Report provided to [facility] for transport to [facility] ED.  ETA 1000

## 2020-12-22 NOTE — H&P (Addendum)
Tracey Copeland [birth year]  [id number].    Chief Complaint/Reason for Consult: biliary pancreatitis  HPI:  This is a pleasant, obese, black female who began having some epigastric and RUQ abdominal pain after eating a couple of months ago.  These resulted in nausea but no emesis.  She presented to [facility] and was initially diagnosed with GERD.  Her symptoms returned as did she.  She then underwent an [place] that revealed cholelithiasis.  She was referred to a surgeon at [facility] and actually had an appointment for today.  However, this morning she began having pain in her LUQ with nausea and vomiting.  She presented to [facility] where she was found to have a lipase in the 400s.  She had a CT scan that was normal.  Her other labs were normal.  She was transferred to [facility] for [place] to evaluate.  She denies any other symptoms such as CP, SOB, fevers, diarrhea, etc.   ROS: ROS: Please see HPI, otherwise all other systems have been reviewed and are negative.  History reviewed. No pertinent family history.  Past Medical History:  Diagnosis Date   Anemia    Asthma    Bipolar 1 disorder ([place])    Blood transfusion without reported diagnosis    Schizophrenia [facility])     Past Surgical History:  Procedure Laterality Date   DILATION AND CURETTAGE OF UTERUS      Social History:  reports that she has never smoked. She has never used smokeless tobacco. She reports that she does not drink alcohol and does not use drugs.  Allergies:  Allergies  Allergen Reactions   Feraheme [Ferumoxytol] Shortness Of Breath   Iron Anaphylaxis    Intravenous iron only   Onion Itching, Swelling and Rash    (Not in a hospital admission)    Physical Exam: Blood pressure (!) 145/87, pulse 67, temperature 98.1 F (36.7 C), temperature source Oral, resp. rate 18, height 5\' 1"  (1.549 m), weight 117.9 kg, last menstrual period 09/27/2020, SpO2 96 %. General: pleasant, WD, obese black female who is laying in bed in NAD HEENT:  head is normocephalic, atraumatic.  Sclera are noninjected.  PERRL.  Ears and nose without any masses or lesions.  Mouth is pink and moist Heart: regular, rate, and rhythm.  Normal s1,s2. No obvious murmurs, gallops, or rubs noted.  Palpable radial and pedal pulses bilaterally Lungs: CTAB, no wheezes, rhonchi, or rales noted.  Respiratory effort nonlabored Abd: soft, mildly tender in epigastrium and RUQ, ND, +BS, no masses, hernias, or organomegaly MS: all 4 extremities are symmetrical with no cyanosis, clubbing, or edema. Skin: warm and dry with no masses, lesions, or rashes Neuro: Cranial nerves 2-12 grossly intact, sensation is normal throughout Psych: A&Ox3 with an appropriate affect.   Results for orders placed or performed during the hospital encounter of 12/22/20 (from the past 48 hour(s))  CBC with Differential/Platelet     Status: Abnormal   Collection Time: 12/22/20  5:40 AM  Result Value Ref Range   WBC 10.1 4.0 - 10.5 K/uL   RBC 4.43 3.87 - 5.11 MIL/uL   Hemoglobin 8.8 (L) 12.0 - 15.0 g/dL    Comment: Reticulocyte Hemoglobin testing may be clinically indicated, consider ordering this additional test [id number]    HCT 29.6 (L) 36.0 - 46.0 %   MCV 66.8 (L) 80.0 - 100.0 fL   MCH 19.9 (L) 26.0 - 34.0 pg   MCHC 29.7 (L) 30.0 - 36.0 g/dL   RDW 18.1 (  H) 11.5 - 15.5 %   Platelets 450 (H) 150 - 400 K/uL    Comment: REPEATED TO VERIFY   nRBC 0.0 0.0 - 0.2 %   Neutrophils Relative % 62 %   Neutro Abs 6.3 1.7 - 7.7 K/uL   Lymphocytes Relative 31 %   Lymphs Abs 3.1 0.7 - 4.0 K/uL   Monocytes Relative 6 %   Monocytes Absolute 0.6 0.1 - 1.0 K/uL   Eosinophils Relative 1 %   Eosinophils Absolute 0.1 0.0 - 0.5 K/uL   Basophils Relative 0 %   Basophils Absolute 0.0 0.0 - 0.1 K/uL   Immature Granulocytes 0 %   Abs Immature Granulocytes 0.03 0.00 - 0.07 K/uL    Comment: Performed at [facility], [address] [place], [place] [address]  Comprehensive metabolic panel      Status: Abnormal   Collection Time: 12/22/20  5:40 AM  Result Value Ref Range   Sodium 138 135 - 145 mmol/L   Potassium 3.0 (L) 3.5 - 5.1 mmol/L   Chloride 102 98 - 111 mmol/L   CO2 27 22 - 32 mmol/L   Glucose, Bld 140 (H) 70 - 99 mg/dL    Comment: Glucose reference range applies only to samples taken after fasting for at least 8 hours.   BUN 11 6 - 20 mg/dL   Creatinine, Ser 0.92 0.44 - 1.00 mg/dL   Calcium 8.8 (L) 8.9 - 10.3 mg/dL   Total Protein 7.6 6.5 - 8.1 g/dL   Albumin 3.6 3.5 - 5.0 g/dL   AST 22 15 - 41 U/L   ALT 17 0 - 44 U/L   Alkaline Phosphatase 124 38 - 126 U/L   Total Bilirubin 0.2 (L) 0.3 - 1.2 mg/dL   GFR, Estimated >60 >60 mL/min    Comment: (NOTE) Calculated using the CKD-EPI Creatinine Equation (2021)    Anion gap 9 5 - 15    Comment: Performed at [facility], [address] [place], [place] [address]  Lipase, blood     Status: Abnormal   Collection Time: 12/22/20  5:40 AM  Result Value Ref Range   Lipase 450 (H) 11 - 51 U/L    Comment: RESULTS CONFIRMED BY MANUAL DILUTION Performed at [facility], [address] [place], [place] [address]   Pregnancy, urine     Status: None   Collection Time: 12/22/20  6:58 AM  Result Value Ref Range   Preg Test, Ur NEGATIVE NEGATIVE    Comment:        THE SENSITIVITY OF THIS METHODOLOGY IS >20 mIU/mL. Performed at [facility], [address] [place], [place] [address]   Resp Panel by RT-PCR (Flu A&B, Covid) Nasopharyngeal Swab     Status: None   Collection Time: 12/22/20  8:35 AM   Specimen: Nasopharyngeal Swab; Nasopharyngeal(NP) swabs in vial transport medium  Result Value Ref Range   SARS Coronavirus 2 by RT PCR NEGATIVE NEGATIVE    Comment: (NOTE) SARS-CoV-2 target nucleic acids are NOT DETECTED.  The SARS-CoV-2 RNA is generally detectable in upper respiratory specimens during the acute phase of infection. The lowest concentration of SARS-CoV-2 viral copies this assay  can detect is 138 copies/mL. A negative result does not preclude SARS-Cov-2 infection and should not be used as the sole basis for treatment or other patient management decisions. A negative result may occur with  improper specimen collection/handling, submission of specimen other than  nasopharyngeal swab, presence of viral mutation(s) within the areas targeted by this assay, and inadequate number of viral copies(<138 copies/mL). A negative result must be combined with clinical observations, patient history, and epidemiological information. The expected result is Negative.  Fact Sheet for Patients:  EntrepreneurPulse.com.au  Fact Sheet for Healthcare Providers:  IncredibleEmployment.be  This test is no t yet approved or cleared by the [place] FDA and  has been authorized for detection and/or diagnosis of SARS-CoV-2 by FDA under an Emergency Use Authorization (EUA). This EUA will remain  in effect (meaning this test can be used) for the duration of the COVID-19 declaration under Section 564(b)(1) of the Act, 21 U.S.C.section 360bbb-3(b)(1), unless the authorization is terminated  or revoked sooner.       Influenza A by PCR NEGATIVE NEGATIVE   Influenza B by PCR NEGATIVE NEGATIVE    Comment: (NOTE) The Xpert Xpress SARS-CoV-2/FLU/RSV plus assay is intended as an aid in the diagnosis of influenza from Nasopharyngeal swab specimens and should not be used as a sole basis for treatment. Nasal washings and aspirates are unacceptable for Xpert Xpress SARS-CoV-2/FLU/RSV testing.  Fact Sheet for Patients: EntrepreneurPulse.com.au  Fact Sheet for Healthcare Providers: IncredibleEmployment.be  This test is not yet approved or cleared by the [place] FDA and has been authorized for detection and/or diagnosis of SARS-CoV-2 by FDA under an Emergency Use Authorization (EUA). This EUA will remain in effect  (meaning this test can be used) for the duration of the COVID-19 declaration under Section 564(b)(1) of the Act, 21 U.S.C. section 360bbb-3(b)(1), unless the authorization is terminated or revoked.  Performed at [facility], [address] [place], [place] [address]    CT ABDOMEN PELVIS W CONTRAST  Result Date: 12/22/2020 CLINICAL DATA:  30 year old female with epigastric pain nausea and vomiting. Gallstones. Query pancreatitis. EXAM: CT ABDOMEN AND PELVIS WITH CONTRAST TECHNIQUE: Multidetector CT imaging of the abdomen and pelvis was performed using the standard protocol following bolus administration of intravenous contrast. CONTRAST:  180mL OMNIPAQUE IOHEXOL 300 MG/ML  SOLN COMPARISON:  Portable chest radiograph 06/13/2020. FINDINGS: Lower chest: Negative. Hepatobiliary: Negative CT appearance of the gallbladder. Negative liver. No bile duct enlargement. Pancreas: Negative. No pancreatic inflammation or abnormality identified. Spleen: Negative. Adrenals/Urinary Tract: Normal adrenal glands. Renal enhancement appears symmetric and normal. Nonobstructed kidneys with decompressed ureters. Diminutive and negative bladder. Occasional pelvic phleboliths but no urinary calculus identified. Stomach/Bowel: Negative large bowel. Normal appendix courses toward the midline and is visible on series 2, image 55 and coronal image 61. Decompressed and negative terminal ileum. No dilated small bowel. Unremarkable stomach. Decompressed duodenum. No free air, free fluid or mesenteric inflammation. Vascular/Lymphatic: Major vascular structures in the abdomen and pelvis appear patent and normal. No lymphadenopathy. Reproductive: Negative. Other: No pelvic free fluid. Musculoskeletal: Negative aside from mild retrolisthesis at the lumbosacral junction, mild facet hypertrophy there. IMPRESSION: Negative CT Abdomen and Pelvis.  Pancreas appears normal. Electronically Signed   By: Genevie Ann M.D.   On: 12/22/2020  07:59      Assessment/Plan Biliary pancreatitis The patient has a history of gallstones and a mildly elevated lipase.  She is still mildly tender.  We will plan to admit her with IVF hydration and pain meds prn.  We will recheck her labs in the am.  If her labs are improving and she is clinically improving, we will likely plan for lap chole with IOC tomorrow.  She is agreeable to this plan.  She will remain NPO  for today.   FEN - NPO/IVFs VTE - lovenox ID - none currently needed, no signs of cholecystitis Admit - obs  Hypokalemia - replace K today, check labs in am Fe deficiency anemia - resume home iron Asthma -  resume home albuterol  Henreitta Cea, PA-C [facility] 12/22/2020, 1:15 PM Please see Amion for pager number during day hours 7:00am-4:30pm or 7:00am -11:30am on weekends

## 2020-12-22 NOTE — ED Provider Notes (Signed)
[place] DEPT [facility] Provider Note: Tracey Spurling, MD, FACEP  CSN: [id number] MRN: [id number] ARRIVAL: 12/22/20 at 0527 ROOM: [place]  Abdominal Pain   HISTORY OF PRESENT ILLNESS  12/22/20 5:33 AM Tracey Copeland is a 30 y.o. female who was diagnosed with cholelithiasis on 12/13/2020 at [facility] after the patient presented with epigastric pain.  She has continued to have episodes since then and was scheduled to meet with a surgeon today for elective cholecystectomy.  She is here with a similar, but more severe, pain in her epigastrium that awakened her about an hour and a half ago.  It is like her previous episodes but this is the worst.  She rates it as a 10 out of 10, worse with movement and certain positions or palpation at the epigastrium.  It is somewhat better when lying on her right side.  She has had associated nausea and vomiting with this but no fever.   Past Medical History:  Diagnosis Date   Anemia    Asthma    Bipolar 1 disorder ([place])    Blood transfusion without reported diagnosis    Schizophrenia [facility])     Past Surgical History:  Procedure Laterality Date   DILATION AND CURETTAGE OF UTERUS      History reviewed. No pertinent family history.  Social History   Tobacco Use   Smoking status: Never   Smokeless tobacco: Never  Vaping Use   Vaping Use: Never used  Substance Use Topics   Alcohol use: No   Drug use: No    Prior to Admission medications   Medication Sig Start Date End Date Taking? Authorizing Provider  albuterol (PROVENTIL HFA;VENTOLIN HFA) 108 (90 Base) MCG/ACT inhaler Inhale 2 puffs into the lungs every 6 (six) hours as needed for wheezing or shortness of breath.   Yes [provider]  EPINEPHrine 0.3 mg/0.3 mL IJ SOAJ injection Inject 0.3 mg into the muscle as needed for anaphylaxis. 01/10/19  Yes [provider]  ergocalciferol (VITAMIN D2) 1.25 MG (50000 UT) capsule Take 1 capsule  (50,000 Units total) by mouth once a week. 09/23/18  Yes [place], Holli Humbles, NP  ferrous sulfate 325 (65 FE) MG tablet Take 1 tablet (325 mg total) by mouth 2 (two) times daily with a meal. 02/06/20  Yes [place], Gwenyth Allegra, MD  Multiple Vitamins-Calcium (ONE-A-DAY WOMENS PO) Take 1 tablet by mouth daily.   Yes [provider]  ondansetron (ZOFRAN-ODT) 4 MG disintegrating tablet Take 4 mg by mouth every 8 (eight) hours as needed for nausea or vomiting. 12/13/20  Yes [provider]  traMADol (ULTRAM) 50 MG tablet Take 50 mg by mouth every 6 (six) hours as needed for moderate pain. 12/13/20  Yes [provider]  folic acid (FOLVITE) 1 MG tablet Take 1 tablet (1 mg total) by mouth daily. Patient not taking: Reported on 12/22/2020 02/29/20   [place], Holli Humbles, NP    Allergies Feraheme [ferumoxytol], Iron, and Onion   REVIEW OF SYSTEMS  Negative except as noted here or in the History of Present Illness.   PHYSICAL EXAMINATION  Initial Vital Signs Blood pressure (!) 119/46, pulse 81, temperature 98.1 F (36.7 C), temperature source Oral, resp. rate (!) 24, height 5\' 1"  (1.549 m), weight 117.9 kg, last menstrual period 12/22/2020, SpO2 100 %.  Examination General: Well-developed, high BMI female in no acute distress but in apparent discomfort; appearance consistent with age of record HENT: normocephalic; atraumatic Eyes: Normal appearance  Neck: supple Heart: regular rate and rhythm Lungs: clear to auscultation bilaterally Abdomen: soft; nondistended; epigastric tenderness; bowel sounds present Extremities: No deformity; full range of motion; pulses normal Neurologic: Awake, alert and oriented; motor function intact in all extremities and symmetric; no facial droop Skin: Warm and dry Psychiatric: Grimacing   RESULTS  Summary of this visit's results, reviewed and interpreted by myself:   EKG Interpretation  Date/Time:    Ventricular Rate:    PR Interval:     QRS Duration:   QT Interval:    QTC Calculation:   R Axis:     Text Interpretation:          Laboratory Studies: Results for orders placed or performed during the hospital encounter of 12/22/20 (from the past 24 hour(s))  CBC with Differential/Platelet     Status: Abnormal   Collection Time: 12/22/20  5:40 AM  Result Value Ref Range   WBC 10.1 4.0 - 10.5 K/uL   RBC 4.43 3.87 - 5.11 MIL/uL   Hemoglobin 8.8 (L) 12.0 - 15.0 g/dL   HCT 29.6 (L) 36.0 - 46.0 %   MCV 66.8 (L) 80.0 - 100.0 fL   MCH 19.9 (L) 26.0 - 34.0 pg   MCHC 29.7 (L) 30.0 - 36.0 g/dL   RDW 18.1 (H) 11.5 - 15.5 %   Platelets 450 (H) 150 - 400 K/uL   nRBC 0.0 0.0 - 0.2 %   Neutrophils Relative % 62 %   Neutro Abs 6.3 1.7 - 7.7 K/uL   Lymphocytes Relative 31 %   Lymphs Abs 3.1 0.7 - 4.0 K/uL   Monocytes Relative 6 %   Monocytes Absolute 0.6 0.1 - 1.0 K/uL   Eosinophils Relative 1 %   Eosinophils Absolute 0.1 0.0 - 0.5 K/uL   Basophils Relative 0 %   Basophils Absolute 0.0 0.0 - 0.1 K/uL   Immature Granulocytes 0 %   Abs Immature Granulocytes 0.03 0.00 - 0.07 K/uL  Comprehensive metabolic panel     Status: Abnormal   Collection Time: 12/22/20  5:40 AM  Result Value Ref Range   Sodium 138 135 - 145 mmol/L   Potassium 3.0 (L) 3.5 - 5.1 mmol/L   Chloride 102 98 - 111 mmol/L   CO2 27 22 - 32 mmol/L   Glucose, Bld 140 (H) 70 - 99 mg/dL   BUN 11 6 - 20 mg/dL   Creatinine, Ser 0.92 0.44 - 1.00 mg/dL   Calcium 8.8 (L) 8.9 - 10.3 mg/dL   Total Protein 7.6 6.5 - 8.1 g/dL   Albumin 3.6 3.5 - 5.0 g/dL   AST 22 15 - 41 U/L   ALT 17 0 - 44 U/L   Alkaline Phosphatase 124 38 - 126 U/L   Total Bilirubin 0.2 (L) 0.3 - 1.2 mg/dL   GFR, Estimated >60 >60 mL/min   Anion gap 9 5 - 15  Lipase, blood     Status: Abnormal   Collection Time: 12/22/20  5:40 AM  Result Value Ref Range   Lipase 450 (H) 11 - 51 U/L  Pregnancy, urine     Status: None   Collection Time: 12/22/20  6:58 AM  Result Value Ref Range   Preg Test,  Ur NEGATIVE NEGATIVE  Resp Panel by RT-PCR (Flu A&B, Covid) Nasopharyngeal Swab     Status: None   Collection Time: 12/22/20  8:35 AM   Specimen: Nasopharyngeal Swab; Nasopharyngeal(NP) swabs in vial transport medium  Result Value Ref Range   SARS Coronavirus  2 by RT PCR NEGATIVE NEGATIVE   Influenza A by PCR NEGATIVE NEGATIVE   Influenza B by PCR NEGATIVE NEGATIVE   Imaging Studies: CT ABDOMEN PELVIS W CONTRAST  Result Date: 12/22/2020 CLINICAL DATA:  30 year old female with epigastric pain nausea and vomiting. Gallstones. Query pancreatitis. EXAM: CT ABDOMEN AND PELVIS WITH CONTRAST TECHNIQUE: Multidetector CT imaging of the abdomen and pelvis was performed using the standard protocol following bolus administration of intravenous contrast. CONTRAST:  127mL OMNIPAQUE IOHEXOL 300 MG/ML  SOLN COMPARISON:  Portable chest radiograph 06/13/2020. FINDINGS: Lower chest: Negative. Hepatobiliary: Negative CT appearance of the gallbladder. Negative liver. No bile duct enlargement. Pancreas: Negative. No pancreatic inflammation or abnormality identified. Spleen: Negative. Adrenals/Urinary Tract: Normal adrenal glands. Renal enhancement appears symmetric and normal. Nonobstructed kidneys with decompressed ureters. Diminutive and negative bladder. Occasional pelvic phleboliths but no urinary calculus identified. Stomach/Bowel: Negative large bowel. Normal appendix courses toward the midline and is visible on series 2, image 55 and coronal image 61. Decompressed and negative terminal ileum. No dilated small bowel. Unremarkable stomach. Decompressed duodenum. No free air, free fluid or mesenteric inflammation. Vascular/Lymphatic: Major vascular structures in the abdomen and pelvis appear patent and normal. No lymphadenopathy. Reproductive: Negative. Other: No pelvic free fluid. Musculoskeletal: Negative aside from mild retrolisthesis at the lumbosacral junction, mild facet hypertrophy there. IMPRESSION: Negative CT  Abdomen and Pelvis.  Pancreas appears normal. Electronically Signed   By: Genevie Ann M.D.   On: 12/22/2020 07:59    ED COURSE and MDM  Nursing notes, initial and subsequent vitals signs, including pulse oximetry, reviewed and interpreted by myself.  Vitals:   12/22/20 1310 12/22/20 1445 12/22/20 1754 12/22/20 2018  BP: (!) 145/87 103/60 112/62 (!) 106/58  Pulse: 67 (!) 56 70 66  Resp: 18 16 18 18   Temp:  98.1 F (36.7 C) 98.2 F (36.8 C) (!) 97.5 F (36.4 C)  TempSrc:  Oral Oral Oral  SpO2: 96% 98% 99% 99%  Weight:      Height:       Medications  ferrous sulfate tablet 325 mg (has no administration in time range)  enoxaparin (LOVENOX) injection 40 mg (40 mg Subcutaneous Given 12/22/20 2050)  dextrose 5 % and 0.45 % NaCl with KCl 20 mEq/L infusion ( Intravenous New Bag/Given 12/22/20 1650)  acetaminophen (TYLENOL) tablet 1,000 mg (has no administration in time range)  oxyCODONE (Oxy IR/ROXICODONE) immediate release tablet 5 mg (has no administration in time range)  HYDROmorphone (DILAUDID) injection 0.5-1 mg (has no administration in time range)  diphenhydrAMINE (BENADRYL) capsule 25 mg (has no administration in time range)    Or  diphenhydrAMINE (BENADRYL) injection 25 mg (has no administration in time range)  ondansetron (ZOFRAN-ODT) disintegrating tablet 4 mg (has no administration in time range)    Or  ondansetron (ZOFRAN) injection 4 mg (has no administration in time range)  simethicone (MYLICON) chewable tablet 40 mg (has no administration in time range)  hydrALAZINE (APRESOLINE) injection 10 mg (has no administration in time range)  albuterol (PROVENTIL) (2.5 MG/3ML) 0.083% nebulizer solution 2.5 mg (has no administration in time range)  ondansetron (ZOFRAN) injection 4 mg (4 mg Intravenous Given 12/22/20 0544)  HYDROmorphone (DILAUDID) injection 1 mg (1 mg Intravenous Given 12/22/20 0544)  potassium chloride SA (KLOR-CON) CR tablet 40 mEq (40 mEq Oral Given 12/22/20 0759)   iohexol (OMNIPAQUE) 300 MG/ML solution 100 mL (100 mLs Intravenous Contrast Given 12/22/20 0728)  sodium chloride 0.9 % bolus 1,000 mL (0 mLs Intravenous Stopped 12/22/20 0950)  HYDROmorphone (DILAUDID) injection 1 mg (1 mg Intravenous Given 12/22/20 0914)  ondansetron (ZOFRAN) injection 4 mg (4 mg Intravenous Given 12/22/20 1103)  HYDROmorphone (DILAUDID) injection 1 mg (1 mg Intravenous Given 12/22/20 1104)   6:52 AM Patient's pain well controlled at this time.  The patient's lipase is elevated at 450.  A lipase was not checked on her previous visit to [facility].  Since her pain is primarily epigastric and not right upper quadrant I am suspicious of pancreatitis.  We will obtain a CT to verify this and to look for evidence of duct stone causing gallstone pancreatitis.  The CT scan, although not as sensitive as ultrasound, can also assess for cholecystitis.  The patient's LFTs are not elevated.  Her anemia is chronic.  7:00 AM CT abd/pelvis pending. Signed out to Dr. Ralene Bathe.  PROCEDURES  Procedures   ED DIAGNOSES     ICD-10-CM   1. Acute biliary pancreatitis without infection or necrosis  K85.10         Lidya Mccalister, Jenny Reichmann, MD 12/22/20 2247

## 2020-12-22 NOTE — ED Notes (Signed)
Transferred to [facility] ER via [place].

## 2020-12-22 NOTE — ED Notes (Signed)
Attempted to give a phone report to floor RN, no response.

## 2020-12-23 ENCOUNTER — Observation Stay (HOSPITAL_COMMUNITY): Payer: Medicaid Other | Admitting: Anesthesiology

## 2020-12-23 ENCOUNTER — Observation Stay (HOSPITAL_COMMUNITY): Payer: Medicaid Other

## 2020-12-23 ENCOUNTER — Encounter (HOSPITAL_COMMUNITY): Admission: EM | Disposition: A | Discharge status: Home / Self Care | Payer: Self-pay | Source: Home / Self Care

## 2020-12-23 ENCOUNTER — Encounter (HOSPITAL_COMMUNITY): Payer: Self-pay

## 2020-12-23 HISTORY — PX: CHOLECYSTECTOMY: SHX55

## 2020-12-23 LAB — CBC
HCT: 29.7 % — ABNORMAL LOW (ref 36.0–46.0)
Hemoglobin: 8.5 g/dL — ABNORMAL LOW (ref 12.0–15.0)
MCH: 19.5 pg — ABNORMAL LOW (ref 26.0–34.0)
MCHC: 28.6 g/dL — ABNORMAL LOW (ref 30.0–36.0)
MCV: 68.3 fL — ABNORMAL LOW (ref 80.0–100.0)
Platelets: 449 10*3/uL — ABNORMAL HIGH (ref 150–400)
RBC: 4.35 MIL/uL (ref 3.87–5.11)
RDW: 18.4 % — ABNORMAL HIGH (ref 11.5–15.5)
WBC: 6.6 10*3/uL (ref 4.0–10.5)
nRBC: 0 % (ref 0.0–0.2)

## 2020-12-23 LAB — COMPREHENSIVE METABOLIC PANEL
ALT: 19 U/L (ref 0–44)
AST: 15 U/L (ref 15–41)
Albumin: 3.6 g/dL (ref 3.5–5.0)
Alkaline Phosphatase: 125 U/L (ref 38–126)
Anion gap: 4 — ABNORMAL LOW (ref 5–15)
BUN: 7 mg/dL (ref 6–20)
CO2: 28 mmol/L (ref 22–32)
Calcium: 8.6 mg/dL — ABNORMAL LOW (ref 8.9–10.3)
Chloride: 107 mmol/L (ref 98–111)
Creatinine, Ser: 0.75 mg/dL (ref 0.44–1.00)
GFR, Estimated: >60 mL/min (ref >60)
Glucose, Bld: 108 mg/dL — ABNORMAL HIGH (ref 70–99)
Potassium: 3.6 mmol/L (ref 3.5–5.1)
Sodium: 139 mmol/L (ref 135–145)
Total Bilirubin: 0.3 mg/dL (ref 0.3–1.2)
Total Protein: 7.5 g/dL (ref 6.5–8.1)

## 2020-12-23 LAB — HIV ANTIBODY (ROUTINE TESTING W REFLEX): HIV Screen 4th Generation wRfx: NONREACTIVE

## 2020-12-23 LAB — LIPASE, BLOOD: Lipase: 58 U/L — ABNORMAL HIGH (ref 11–51)

## 2020-12-23 SURGERY — LAPAROSCOPIC CHOLECYSTECTOMY WITH INTRAOPERATIVE CHOLANGIOGRAM
Anesthesia: General

## 2020-12-23 MED ORDER — ROCURONIUM BROMIDE 10 MG/ML (PF) SYRINGE
PREFILLED_SYRINGE | INTRAVENOUS | Status: DC | PRN
  Administered 2020-12-23: 40 mg via INTRAVENOUS

## 2020-12-23 MED ORDER — MIDAZOLAM HCL 2 MG/2ML IJ SOLN
INTRAMUSCULAR | Status: AC
  Filled 2020-12-23: qty 2

## 2020-12-23 MED ORDER — SODIUM CHLORIDE (PF) 0.9 % IJ SOLN
INTRAMUSCULAR | Status: DC | PRN
  Administered 2020-12-23: 10 mL

## 2020-12-23 MED ORDER — HYDROMORPHONE HCL 1 MG/ML IJ SOLN
0.2500 mg | INTRAMUSCULAR | Status: DC | PRN
  Administered 2020-12-23: 0.5 mg via INTRAVENOUS

## 2020-12-23 MED ORDER — SUCCINYLCHOLINE CHLORIDE 200 MG/10ML IV SOSY
PREFILLED_SYRINGE | INTRAVENOUS | Status: DC | PRN
  Administered 2020-12-23: 160 mg via INTRAVENOUS

## 2020-12-23 MED ORDER — PROPOFOL 10 MG/ML IV BOLUS
INTRAVENOUS | Status: DC | PRN
  Administered 2020-12-23: 200 mg via INTRAVENOUS

## 2020-12-23 MED ORDER — SODIUM CHLORIDE 0.9 % IV SOLN
2.0000 g | INTRAVENOUS | Status: AC
  Administered 2020-12-23: 2 g via INTRAVENOUS
  Filled 2020-12-23: qty 20

## 2020-12-23 MED ORDER — EPHEDRINE SULFATE-NACL 50-0.9 MG/10ML-% IV SOSY
PREFILLED_SYRINGE | INTRAVENOUS | Status: DC | PRN
  Administered 2020-12-23: 10 mg via INTRAVENOUS

## 2020-12-23 MED ORDER — DEXMEDETOMIDINE (PRECEDEX) IN NS 20 MCG/5ML (4 MCG/ML) IV SYRINGE
PREFILLED_SYRINGE | INTRAVENOUS | Status: DC | PRN
  Administered 2020-12-23: 12 ug via INTRAVENOUS

## 2020-12-23 MED ORDER — ENOXAPARIN SODIUM 40 MG/0.4ML IJ SOSY
40.0000 mg | PREFILLED_SYRINGE | INTRAMUSCULAR | Status: DC
  Administered 2020-12-24: 40 mg via SUBCUTANEOUS
  Filled 2020-12-23: qty 0.4

## 2020-12-23 MED ORDER — SUGAMMADEX SODIUM 500 MG/5ML IV SOLN
INTRAVENOUS | Status: AC
  Filled 2020-12-23: qty 5

## 2020-12-23 MED ORDER — MIDAZOLAM HCL 5 MG/5ML IJ SOLN
INTRAMUSCULAR | Status: DC | PRN
  Administered 2020-12-23: 2 mg via INTRAVENOUS

## 2020-12-23 MED ORDER — FENTANYL CITRATE (PF) 250 MCG/5ML IJ SOLN
INTRAMUSCULAR | Status: AC
  Filled 2020-12-23: qty 5

## 2020-12-23 MED ORDER — KETOROLAC TROMETHAMINE 30 MG/ML IJ SOLN
30.0000 mg | Freq: Three times a day (TID) | INTRAMUSCULAR | Status: DC
  Administered 2020-12-23 – 2020-12-24 (×3): 30 mg via INTRAVENOUS
  Filled 2020-12-23 (×3): qty 1

## 2020-12-23 MED ORDER — HYDROMORPHONE HCL 1 MG/ML IJ SOLN
INTRAMUSCULAR | Status: AC
  Administered 2020-12-23: 0.25 mg via INTRAVENOUS
  Filled 2020-12-23: qty 1

## 2020-12-23 MED ORDER — LACTATED RINGERS IV SOLN: INTRAVENOUS | Status: DC

## 2020-12-23 MED ORDER — MEPERIDINE HCL 50 MG/ML IJ SOLN: 6.2500 mg | INTRAMUSCULAR | Status: DC | PRN

## 2020-12-23 MED ORDER — SUGAMMADEX SODIUM 500 MG/5ML IV SOLN
INTRAVENOUS | Status: DC | PRN
  Administered 2020-12-23: 350 mg via INTRAVENOUS

## 2020-12-23 MED ORDER — KETOROLAC TROMETHAMINE 30 MG/ML IJ SOLN
30.0000 mg | Freq: Once | INTRAMUSCULAR | Status: AC | PRN
  Administered 2020-12-23: 30 mg via INTRAVENOUS

## 2020-12-23 MED ORDER — ONDANSETRON HCL 4 MG/2ML IJ SOLN
INTRAMUSCULAR | Status: DC | PRN
  Administered 2020-12-23: 4 mg via INTRAVENOUS

## 2020-12-23 MED ORDER — BUPIVACAINE HCL (PF) 0.5 % IJ SOLN
INTRAMUSCULAR | Status: DC | PRN
  Administered 2020-12-23: 20 mL

## 2020-12-23 MED ORDER — ESMOLOL HCL 100 MG/10ML IV SOLN
INTRAVENOUS | Status: AC
  Filled 2020-12-23: qty 10

## 2020-12-23 MED ORDER — LIDOCAINE 2% (20 MG/ML) 5 ML SYRINGE
INTRAMUSCULAR | Status: DC | PRN
  Administered 2020-12-23: 80 mg via INTRAVENOUS

## 2020-12-23 MED ORDER — DEXAMETHASONE SODIUM PHOSPHATE 10 MG/ML IJ SOLN
INTRAMUSCULAR | Status: DC | PRN
  Administered 2020-12-23: 10 mg via INTRAVENOUS

## 2020-12-23 MED ORDER — EPHEDRINE 5 MG/ML INJ
INTRAVENOUS | Status: AC
  Filled 2020-12-23: qty 10

## 2020-12-23 MED ORDER — CHLORHEXIDINE GLUCONATE 0.12 % MT SOLN
15.0000 mL | Freq: Once | OROMUCOSAL | Status: AC
  Administered 2020-12-23: 15 mL via OROMUCOSAL

## 2020-12-23 MED ORDER — PROMETHAZINE HCL 25 MG/ML IJ SOLN: 6.2500 mg | INTRAMUSCULAR | Status: DC | PRN

## 2020-12-23 MED ORDER — KETOROLAC TROMETHAMINE 30 MG/ML IJ SOLN
INTRAMUSCULAR | Status: AC
  Filled 2020-12-23: qty 1

## 2020-12-23 MED ORDER — HYDROMORPHONE HCL 1 MG/ML IJ SOLN
INTRAMUSCULAR | Status: AC
  Administered 2020-12-23: 0.5 mg via INTRAVENOUS
  Filled 2020-12-23: qty 1

## 2020-12-23 MED ORDER — FENTANYL CITRATE (PF) 100 MCG/2ML IJ SOLN
INTRAMUSCULAR | Status: DC | PRN
  Administered 2020-12-23: 100 ug via INTRAVENOUS
  Administered 2020-12-23 (×3): 50 ug via INTRAVENOUS

## 2020-12-23 SURGICAL SUPPLY — 26 items
APPLIER CLIP 5 13 M/L LIGAMAX5 (MISCELLANEOUS) ×2
CABLE HIGH FREQUENCY MONO STRZ (ELECTRODE) ×2 IMPLANT
CHLORAPREP W/TINT 26 (MISCELLANEOUS) ×2 IMPLANT
CLIP APPLIE 5 13 M/L LIGAMAX5 (MISCELLANEOUS) ×1 IMPLANT
COVER MAYO STAND STRL (DRAPES) ×2 IMPLANT
DERMABOND ADVANCED (GAUZE/BANDAGES/DRESSINGS) ×1
DERMABOND ADVANCED .7 DNX12 (GAUZE/BANDAGES/DRESSINGS) ×1 IMPLANT
DRAPE C-ARM 42X120 X-RAY (DRAPES) ×2 IMPLANT
ELECT REM PT RETURN 15FT ADLT (MISCELLANEOUS) ×2 IMPLANT
GLOVE SURG ENC MOIS LTX SZ7.5 (GLOVE) ×2 IMPLANT
GOWN STRL REUS W/TWL XL LVL3 (GOWN DISPOSABLE) ×4 IMPLANT
HEMOSTAT SURGICEL 4X8 (HEMOSTASIS) IMPLANT
KIT BASIN OR (CUSTOM PROCEDURE TRAY) ×2 IMPLANT
KIT TURNOVER KIT A (KITS) ×2 IMPLANT
POUCH SPECIMEN RETRIEVAL 10MM (ENDOMECHANICALS) ×2 IMPLANT
SCISSORS LAP 5X35 DISP (ENDOMECHANICALS) ×2 IMPLANT
SET CHOLANGIOGRAPH MIX (MISCELLANEOUS) ×2 IMPLANT
SET IRRIG TUBING LAPAROSCOPIC (IRRIGATION / IRRIGATOR) ×2 IMPLANT
SET TUBE SMOKE EVAC HIGH FLOW (TUBING) ×2 IMPLANT
SLEEVE XCEL OPT CAN 5 100 (ENDOMECHANICALS) ×4 IMPLANT
SUT MNCRL AB 4-0 PS2 18 (SUTURE) ×2 IMPLANT
TOWEL OR 17X26 10 PK STRL BLUE (TOWEL DISPOSABLE) ×2 IMPLANT
TOWEL OR NON WOVEN STRL DISP B (DISPOSABLE) ×2 IMPLANT
TRAY LAPAROSCOPIC (CUSTOM PROCEDURE TRAY) ×2 IMPLANT
TROCAR BLADELESS OPT 5 100 (ENDOMECHANICALS) ×2 IMPLANT
TROCAR XCEL BLUNT TIP 100MML (ENDOMECHANICALS) ×2 IMPLANT

## 2020-12-23 NOTE — Progress Notes (Signed)
Spoke to [facility], Therapist, sports and received verbal report for surgery this morning.

## 2020-12-23 NOTE — Anesthesia Procedure Notes (Signed)
Procedure Name: Intubation Date/Time: 12/23/2020 10:44 AM Performed by: Lavina Hamman, CRNA Pre-anesthesia Checklist: Patient identified, Emergency Drugs available, Suction available, Patient being monitored and Timeout performed Patient Re-evaluated:Patient Re-evaluated prior to induction Oxygen Delivery Method: Circle system utilized Preoxygenation: Pre-oxygenation with 100% oxygen Induction Type: IV induction Ventilation: Mask ventilation without difficulty Laryngoscope Size: Mac and 3 Grade View: Grade II Tube type: Oral Tube size: 7.0 mm Number of attempts: 1 Airway Equipment and Method: Stylet Placement Confirmation: ETT inserted through vocal cords under direct vision, positive ETCO2, CO2 detector and breath sounds checked- equal and bilateral Secured at: 22 cm Tube secured with: Tape Dental Injury: Teeth and Oropharynx as per pre-operative assessment  Comments: ATOI

## 2020-12-23 NOTE — Progress Notes (Signed)
Patient ID: Tracey Copeland, female   DOB: 13-Jun-1991, 30 y.o.   MRN: [id number]  Pre Procedure note for inpatients:   Tracey Copeland has been scheduled for Procedure(s): LAPAROSCOPIC CHOLECYSTECTOMY WITH INTRAOPERATIVE CHOLANGIOGRAM (N/A) today. The various methods of treatment have been discussed with the patient. After consideration of the risks, benefits and treatment options the patient has consented to the planned procedure.   The patient has been seen and labs reviewed. There are no changes in the patient's condition to prevent proceeding with the planned procedure today.  Recent labs:  Lab Results  Component Value Date   WBC 6.6 12/23/2020   HGB 8.5 (L) 12/23/2020   HCT 29.7 (L) 12/23/2020   PLT 449 (H) 12/23/2020   GLUCOSE 108 (H) 12/23/2020   ALT 19 12/23/2020   AST 15 12/23/2020   NA 139 12/23/2020   K 3.6 12/23/2020   CL 107 12/23/2020   CREATININE 0.75 12/23/2020   BUN 7 12/23/2020   CO2 28 12/23/2020   INR 0.99 08/25/2018    Coralie Keens, MD 12/23/2020 10:11 AM

## 2020-12-23 NOTE — Transfer of Care (Signed)
Immediate Anesthesia Transfer of Care Note  Patient: Tracey Copeland  Procedure(s) Performed: Procedure(s): LAPAROSCOPIC CHOLECYSTECTOMY WITH INTRAOPERATIVE CHOLANGIOGRAM (N/A)  Patient Location: PACU  Anesthesia Type:General  Level of Consciousness:  sedated, patient cooperative and responds to stimulation  Airway & Oxygen Therapy:Patient Spontanous Breathing and Patient connected to face mask oxgen  Post-op Assessment:  Report given to PACU RN and Post -op Vital signs reviewed and stable  Post vital signs:  Reviewed and stable  Last Vitals:  Vitals:   12/23/20 0521 12/23/20 0921  BP: (!) 104/50 (!) 109/56  Pulse: 68 64  Resp: 18 16  Temp: 37 C 36.8 C  SpO2: 037% 543%    Complications: No apparent anesthesia complications

## 2020-12-23 NOTE — Op Note (Addendum)
Laparoscopic Cholecystectomy with IOC Procedure Note  Indications: This patient presents with symptomatic gallbladder disease and will undergo laparoscopic cholecystectomy.  Pre-operative Diagnosis: gallstone pancreatitis  Post-operative Diagnosis: Same  Surgeon: Coralie Keens   Assistants: 0  Anesthesia: General endotracheal anesthesia  ASA Class: 2  Procedure Details  The patient was seen again in the Holding Room. The risks, benefits, complications, treatment options, and expected outcomes were discussed with the patient. The possibilities of reaction to medication, pulmonary aspiration, perforation of viscus, bleeding, recurrent infection, finding a normal gallbladder, the need for additional procedures, failure to diagnose a condition, the possible need to convert to an open procedure, and creating a complication requiring transfusion or operation were discussed with the patient. The likelihood of improving the patient's symptoms with return to their baseline status is good.  The patient and/or family concurred with the proposed plan, giving informed consent. The site of surgery properly noted. The patient was taken to Operating Room, identified as Tracey Copeland and the procedure verified as Laparoscopic Cholecystectomy with Intraoperative Cholangiogram. A Time Out was held and the above information confirmed.  Prior to the induction of general anesthesia, antibiotic prophylaxis was administered. General endotracheal anesthesia was then administered and tolerated well. After the induction, the abdomen was prepped with Chloraprep and draped in the sterile fashion. The patient was positioned in the supine position.  Local anesthetic agent was injected into the skin near the umbilicus and an incision made. We dissected down to the abdominal fascia with blunt dissection.  The fascia was incised vertically and we entered the peritoneal cavity bluntly.  A pursestring suture of 0-Vicryl  was placed around the fascial opening.  The Hasson cannula was inserted and secured with the stay suture.  Pneumoperitoneum was then created with CO2 and tolerated well without any adverse changes in the patient's vital signs. A 5-mm port was placed in the subxiphoid position.  Two 5-mm ports were placed in the right upper quadrant. All skin incisions were infiltrated with a local anesthetic agent before making the incision and placing the trocars.   We positioned the patient in reverse Trendelenburg, tilted slightly to the patient's left.  The gallbladder was identified, the fundus grasped and retracted cephalad. Adhesions were lysed bluntly and with the electrocautery where indicated, taking care not to injure any adjacent organs or viscus. The infundibulum was grasped and retracted laterally, exposing the peritoneum overlying the triangle of Calot. This was then divided and exposed in a blunt fashion. A critical view of the cystic duct and cystic artery was obtained.  The cystic duct was clearly identified and bluntly dissected circumferentially. The cystic duct was ligated with a clip distally.   An incision was made in the cystic duct and the [facility] cholangiogram catheter introduced. The catheter was secured using a clip. A cholangiogram was then obtained which showed good visualization of the distal and proximal biliary tree with no sign of filling defects or obstruction.  Contrast flowed easily into the duodenum. The catheter was then removed.   The cystic duct was then ligated with clips and divided. The cystic artery was identified, dissected free, ligated with clips and divided as well.   The gallbladder was dissected from the liver bed in retrograde fashion with the electrocautery. The gallbladder was removed and placed in an Endocatch sac. The liver bed was irrigated and inspected. Hemostasis was achieved with the electrocautery. Copious irrigation was utilized and was repeatedly aspirated until  clear.  The gallbladder and Endocatch sac were then removed  through the umbilical port site.  The pursestring suture was used to close the umbilical fascia.    We again inspected the right upper quadrant for hemostasis.  Pneumoperitoneum was released as we removed the trocars.  4-0 Monocryl was used to close the skin.   Skin glue was then applied. The patient was then extubated and brought to the recovery room in stable condition. Instrument, sponge, and needle counts were correct at closure and at the conclusion of the case.   Findings: Mild chronic Cholecystitis with Cholelithiasis C-gram normal  Estimated Blood Loss: Minimal         Drains: 0         Specimens: Gallbladder           Complications: None; patient tolerated the procedure well.         Disposition: PACU - hemodynamically stable.         Condition: stable

## 2020-12-23 NOTE — Progress Notes (Signed)
Day of Surgery  Subjective: Patient feels great this morning.  No pain at all.  No nausea.  ROS: See above, otherwise other systems negative  Objective: Vital signs in last 24 hours: Temp:  [97.5 F (36.4 C)-98.6 F (37 C)] 98.6 F (37 C) (06/17 0521) Pulse Rate:  [56-72] 68 (06/17 0521) Resp:  [14-18] 18 (06/17 0521) BP: (102-159)/(50-105) 104/50 (06/17 0521) SpO2:  [96 %-100 %] 100 % (06/17 0521) Last BM Date: 12/21/20  Intake/Output from previous day: 06/16 0701 - 06/17 0700 In: 2889.1 [I.V.:1866.2; IV Piggyback:1022.9] Out: 900 [Urine:900] Intake/Output this shift: No intake/output data recorded.  PE: Gen: NAD Heart: regular Lungs: CTAB Abd: soft, NT, obese, +BS  Lab Results:  Recent Labs    12/22/20 0540 12/23/20 0423  WBC 10.1 6.6  HGB 8.8* 8.5*  HCT 29.6* 29.7*  PLT 450* 449*   BMET Recent Labs    12/22/20 0540 12/23/20 0423  NA 138 139  K 3.0* 3.6  CL 102 107  CO2 27 28  GLUCOSE 140* 108*  BUN 11 7  CREATININE 0.92 0.75  CALCIUM 8.8* 8.6*   PT/INR No results for input(s): LABPROT, INR in the last 72 hours. CMP     Component Value Date/Time   NA 139 12/23/2020 0423   K 3.6 12/23/2020 0423   CL 107 12/23/2020 0423   CO2 28 12/23/2020 0423   GLUCOSE 108 (H) 12/23/2020 0423   BUN 7 12/23/2020 0423   CREATININE 0.75 12/23/2020 0423   CREATININE 0.77 09/12/2018 1300   CALCIUM 8.6 (L) 12/23/2020 0423   PROT 7.5 12/23/2020 0423   ALBUMIN 3.6 12/23/2020 0423   AST 15 12/23/2020 0423   AST 11 (L) 09/12/2018 1300   ALT 19 12/23/2020 0423   ALT 13 09/12/2018 1300   ALKPHOS 125 12/23/2020 0423   BILITOT 0.3 12/23/2020 0423   BILITOT 0.2 (L) 09/12/2018 1300   GFRNONAA >60 12/23/2020 0423   GFRNONAA >60 09/12/2018 1300   GFRAA >60 05/23/2019 0450   GFRAA >60 09/12/2018 1300   Lipase     Component Value Date/Time   LIPASE 58 (H) 12/23/2020 0423       Studies/Results: CT ABDOMEN PELVIS W CONTRAST  Result Date:  12/22/2020 CLINICAL DATA:  30 year old female with epigastric pain nausea and vomiting. Gallstones. Query pancreatitis. EXAM: CT ABDOMEN AND PELVIS WITH CONTRAST TECHNIQUE: Multidetector CT imaging of the abdomen and pelvis was performed using the standard protocol following bolus administration of intravenous contrast. CONTRAST:  131mL OMNIPAQUE IOHEXOL 300 MG/ML  SOLN COMPARISON:  Portable chest radiograph 06/13/2020. FINDINGS: Lower chest: Negative. Hepatobiliary: Negative CT appearance of the gallbladder. Negative liver. No bile duct enlargement. Pancreas: Negative. No pancreatic inflammation or abnormality identified. Spleen: Negative. Adrenals/Urinary Tract: Normal adrenal glands. Renal enhancement appears symmetric and normal. Nonobstructed kidneys with decompressed ureters. Diminutive and negative bladder. Occasional pelvic phleboliths but no urinary calculus identified. Stomach/Bowel: Negative large bowel. Normal appendix courses toward the midline and is visible on series 2, image 55 and coronal image 61. Decompressed and negative terminal ileum. No dilated small bowel. Unremarkable stomach. Decompressed duodenum. No free air, free fluid or mesenteric inflammation. Vascular/Lymphatic: Major vascular structures in the abdomen and pelvis appear patent and normal. No lymphadenopathy. Reproductive: Negative. Other: No pelvic free fluid. Musculoskeletal: Negative aside from mild retrolisthesis at the lumbosacral junction, mild facet hypertrophy there. IMPRESSION: Negative CT Abdomen and Pelvis.  Pancreas appears normal. Electronically Signed   By: Genevie Ann M.D.   On: 12/22/2020 07:59  Anti-infectives: Anti-infectives (From admission, onward)    None        Assessment/Plan Biliary pancreatitis -feels well today -labs unremarkable this am and lipase has essentially resolved -plan for lap chole with IOC today.  This was discussed with her yesterday by Dr. Ninfa Linden and she is agreeable to  proceed -home later today vs tomorrow     FEN - NPO/IVFs VTE - lovenox ID - rocephin on call to OR Admit - obs   Hypokalemia - resolved Fe deficiency anemia - resume home iron, hgb stable Asthma -  resume home albuterol   LOS: 0 days    Henreitta Cea , [facility] Surgery 12/23/2020, 9:15 AM Please see Amion for pager number during day hours 7:00am-4:30pm or 7:00am -11:30am on weekends

## 2020-12-23 NOTE — Anesthesia Preprocedure Evaluation (Signed)
Anesthesia Evaluation  Patient identified by MRN, date of birth, ID band Patient awake    Reviewed: Allergy & Precautions, NPO status , Patient's Chart, lab work & pertinent test results  Airway Mallampati: III       Dental no notable dental hx.    Pulmonary asthma ,    Pulmonary exam normal        Cardiovascular negative cardio ROS Normal cardiovascular exam Rhythm:Regular Rate:Normal     Neuro/Psych Anxiety Bipolar Disorder negative neurological ROS     GI/Hepatic negative GI ROS, Neg liver ROS,   Endo/Other  Morbid obesity  Renal/GU negative Renal ROS  negative genitourinary   Musculoskeletal negative musculoskeletal ROS (+)   Abdominal   Peds  Hematology  (+) anemia ,   Anesthesia Other Findings   Reproductive/Obstetrics                             Anesthesia Physical Anesthesia Plan  ASA: 3  Anesthesia Plan: General   Post-op Pain Management:    Induction: Intravenous  PONV Risk Score and Plan: 4 or greater and Ondansetron, Dexamethasone and Midazolam  Airway Management Planned: Oral ETT  Additional Equipment: None  Intra-op Plan:   Post-operative Plan: Extubation in OR  Informed Consent: I have reviewed the patients History and Physical, chart, labs and discussed the procedure including the risks, benefits and alternatives for the proposed anesthesia with the patient or authorized representative who has indicated his/her understanding and acceptance.     Dental advisory given  Plan Discussed with: CRNA  Anesthesia Plan Comments:         Anesthesia Quick Evaluation

## 2020-12-23 NOTE — Discharge Instructions (Addendum)
[facility]  Please arrive at least 30 min before your appointment to complete your check in paperwork.  If you are unable to arrive 30 min prior to your appointment time we may have to cancel or reschedule you. LAPAROSCOPIC SURGERY: POST OP INSTRUCTIONS Always review your discharge instruction sheet given to you by the facility where your surgery was performed. IF YOU HAVE DISABILITY OR FAMILY LEAVE FORMS, YOU MUST BRING THEM TO THE OFFICE FOR PROCESSING.   DO NOT GIVE THEM TO YOUR DOCTOR.  PAIN CONTROL  First take acetaminophen (Tylenol) AND/or ibuprofen (Advil) to control your pain after surgery.  Follow directions on package.  Taking acetaminophen (Tylenol) and/or ibuprofen (Advil) regularly after surgery will help to control your pain and lower the amount of prescription pain medication you may need.  You should not take more than 4,000 mg (4 grams) of acetaminophen (Tylenol) in 24 hours.  You should not take ibuprofen (Advil), aleve, motrin, naprosyn or other NSAIDS if you have a history of stomach ulcers or chronic kidney disease.  A prescription for pain medication may be given to you upon discharge.  Take your pain medication as prescribed, if you still have uncontrolled pain after taking acetaminophen (Tylenol) or ibuprofen (Advil). Use ice packs to help control pain. If you need a refill on your pain medication, please contact your pharmacy.  They will contact our office to request authorization. Prescriptions will not be filled after 5pm or on week-ends.  HOME MEDICATIONS Take your usually prescribed medications unless otherwise directed.  DIET You should follow a light diet the first few days after arrival home.  Be sure to include lots of fluids daily. Avoid fatty, fried foods.   CONSTIPATION It is common to experience some constipation after surgery and if you are taking pain medication.  Increasing fluid intake and taking a stool softener (such as Colace)  will usually help or prevent this problem from occurring.  A mild laxative (Milk of Magnesia or Miralax) should be taken according to package instructions if there are no bowel movements after 48 hours.  WOUND/INCISION CARE Most patients will experience some swelling and bruising in the area of the incisions.  Ice packs will help.  Swelling and bruising can take several days to resolve.  Unless discharge instructions indicate otherwise, follow guidelines below  STERI-STRIPS - you may remove your outer bandages 48 hours after surgery, and you may shower at that time.  You have steri-strips (small skin tapes) in place directly over the incision.  These strips should be left on the skin for 7-10 days.   DERMABOND/SKIN GLUE - you may shower in 24 hours.  The glue will flake off over the next 2-3 weeks. Any sutures or staples will be removed at the office during your follow-up visit.  ACTIVITIES You may resume regular (light) daily activities beginning the next day--such as daily self-care, walking, climbing stairs--gradually increasing activities as tolerated.  You may have sexual intercourse when it is comfortable.  Refrain from any heavy lifting or straining until approved by your doctor. You may drive when you are no longer taking prescription pain medication, you can comfortably wear a seatbelt, and you can safely maneuver your car and apply brakes.  FOLLOW-UP You should see your doctor in the office for a follow-up appointment approximately 2-3 weeks after your surgery.  You should have been given your post-op/follow-up appointment when your surgery was scheduled.  If you did not receive a post-op/follow-up appointment, make sure   that you call for this appointment within a day or two after you arrive home to insure a convenient appointment time.   WHEN TO CALL YOUR DOCTOR: Fever over 101.0 Inability to urinate Continued bleeding from incision. Increased pain, redness, or drainage from the  incision. Increasing abdominal pain  The clinic staff is available to answer your questions during regular business hours.  Please don't hesitate to call and ask to speak to one of the nurses for clinical concerns.  If you have a medical emergency, go to the nearest emergency room or call 911.  A surgeon from [facility] is always on call at the hospital. [address], [place], [place], [place]  [address] ? [address], [place], [place]   [address] [phone] ? [phone] ? FAX [phone]     Managing Your Pain After Surgery Without Opioids    Thank you for participating in our program to help patients manage their pain after surgery without opioids. This is part of our effort to provide you with the best care possible, without exposing you or your family to the risk that opioids pose.  What pain can I expect after surgery? You can expect to have some pain after surgery. This is normal. The pain is typically worse the day after surgery, and quickly begins to get better. Many studies have found that many patients are able to manage their pain after surgery with Over-the-Counter (OTC) medications such as Tylenol and Motrin. If you have a condition that does not allow you to take Tylenol or Motrin, notify your surgical team.  How will I manage my pain? The best strategy for controlling your pain after surgery is around the clock pain control with Tylenol (acetaminophen) and Motrin (ibuprofen or Advil). Alternating these medications with each other allows you to maximize your pain control. In addition to Tylenol and Motrin, you can use heating pads or ice packs on your incisions to help reduce your pain.  How will I alternate your regular strength over-the-counter pain medication? You will take a dose of pain medication every three hours. Start by taking 650 mg of Tylenol (2 pills of 325 mg) 3 hours later take 600 mg of Motrin (3 pills of 200 mg) 3 hours after  taking the Motrin take 650 mg of Tylenol 3 hours after that take 600 mg of Motrin.   - 1 -  See example - if your first dose of Tylenol is at 12:00 PM   12:00 PM Tylenol 650 mg (2 pills of 325 mg)  3:00 PM Motrin 600 mg (3 pills of 200 mg)  6:00 PM Tylenol 650 mg (2 pills of 325 mg)  9:00 PM Motrin 600 mg (3 pills of 200 mg)  Continue alternating every 3 hours   We recommend that you follow this schedule around-the-clock for at least 3 days after surgery, or until you feel that it is no longer needed. Use the table on the last page of this handout to keep track of the medications you are taking. Important: Do not take more than 3000mg  of Tylenol or 1800mg  of Motrin in a 24-hour period. Do not take ibuprofen/Motrin if you have a history of bleeding stomach ulcers, severe kidney disease, &/or actively taking a blood thinner  What if I still have pain? If you have pain that is not controlled with the over-the-counter pain medications (Tylenol and Motrin or Advil) you might have what we call "breakthrough" pain. You will receive a prescription  for a small amount of an opioid pain medication such as Oxycodone, Tramadol, or Tylenol with Codeine. Use these opioid pills in the first 24 hours after surgery if you have breakthrough pain. Do not take more than 1 pill every 4-6 hours.  If you still have uncontrolled pain after using all opioid pills, don't hesitate to call our staff using the number provided. We will help make sure you are managing your pain in the best way possible, and if necessary, we can provide a prescription for additional pain medication.   Day 1    Time  Name of Medication Number of pills taken  Amount of Acetaminophen  Pain Level   Comments  AM PM       AM PM       AM PM       AM PM       AM PM       AM PM       AM PM       AM PM       Total Daily amount of Acetaminophen Do not take more than  3,000 mg per day      Day 2    Time  Name of Medication  Number of pills taken  Amount of Acetaminophen  Pain Level   Comments  AM PM       AM PM       AM PM       AM PM       AM PM       AM PM       AM PM       AM PM       Total Daily amount of Acetaminophen Do not take more than  3,000 mg per day      Day 3    Time  Name of Medication Number of pills taken  Amount of Acetaminophen  Pain Level   Comments  AM PM       AM PM       AM PM       AM PM          AM PM       AM PM       AM PM       AM PM       Total Daily amount of Acetaminophen Do not take more than  3,000 mg per day      Day 4    Time  Name of Medication Number of pills taken  Amount of Acetaminophen  Pain Level   Comments  AM PM       AM PM       AM PM       AM PM       AM PM       AM PM       AM PM       AM PM       Total Daily amount of Acetaminophen Do not take more than  3,000 mg per day      Day 5    Time  Name of Medication Number of pills taken  Amount of Acetaminophen  Pain Level   Comments  AM PM       AM PM       AM PM       AM PM       AM PM       AM  PM       AM PM       AM PM       Total Daily amount of Acetaminophen Do not take more than  3,000 mg per day       Day 6    Time  Name of Medication Number of pills taken  Amount of Acetaminophen  Pain Level  Comments  AM PM       AM PM       AM PM       AM PM       AM PM       AM PM       AM PM       AM PM       Total Daily amount of Acetaminophen Do not take more than  3,000 mg per day      Day 7    Time  Name of Medication Number of pills taken  Amount of Acetaminophen  Pain Level   Comments  AM PM       AM PM       AM PM       AM PM       AM PM       AM PM       AM PM       AM PM       Total Daily amount of Acetaminophen Do not take more than  3,000 mg per day        For additional information about how and where to safely dispose of unused opioid medications - [employer]  Disclaimer: This document  contains information and/or instructional materials adapted from [place] for the typical patient with your condition. It does not replace medical advice from your health care provider because your experience may differ from that of the typical patient. Talk to your health care provider if you have any questions about this document, your condition or your treatment plan. Adapted from [place]

## 2020-12-23 NOTE — Discharge Summary (Signed)
Patient ID: Tracey Copeland [id number] 10/31/1990 30 y.o.  Admit date: 12/22/2020 Discharge date: 12/24/2020  Admitting Diagnosis: Biliary pancreatitis  Discharge Diagnosis Patient Active Problem List   Diagnosis Date Noted   Acute biliary pancreatitis 12/22/2020   IDA (iron deficiency anemia) 09/11/2018   Symptomatic anemia 08/24/2018   Menorrhagia 03/23/2018   Anemia 03/23/2018  S/p lap chole, Dr. Ninfa Linden 6/17  Consultants none  Reason for Admission: This is a pleasant, obese, black female who began having some epigastric and RUQ abdominal pain after eating a couple of months ago.  These resulted in nausea but no emesis.  She presented to [facility] and was initially diagnosed with GERD.  Her symptoms returned as did she.  She then underwent an [place] that revealed cholelithiasis.  She was referred to a surgeon at [facility] and actually had an appointment for today.  However, this morning she began having pain in her LUQ with nausea and vomiting.  She presented to [facility] where she was found to have a lipase in the 400s.  She had a CT scan that was normal.  Her other labs were normal.  She was transferred to [facility] for [place] to evaluate.  She denies any other symptoms such as CP, SOB, fevers, diarrhea, etc.  Procedures Lap chole with IOC, Dr. Ninfa Linden 6/17  Hospital Course:  The patient was admitted and underwent a laparoscopic cholecystectomy with IOC on HD1 as her pancreatitis had resolved and she was clinically much better.  The patient tolerated the procedure well.  On POD 1, the patient was tolerating a full liquid diet, voiding well, mobilizing, and pain was controlled with oral pain medications. She was having some discomfort around the umbilicus which isn't uncommon. The patient was stable for DC home at this time with appropriate follow up made. We discussed dc instructions   Physical Exam: BP (!) 100/52 (BP Location: Left Arm)   Pulse 74   Temp 97.7 F (36.5 C) (Oral)   Resp 18    Ht 5\' 1"  (1.549 m)   Wt 117.9 kg   LMP 09/27/2020 Comment: negative pregnancy test 12/22/20  SpO2 95%   BMI 49.11 kg/m   Gen: alert, NAD, non-toxic appearing Pupils: equal, no scleral icterus Pulm: Lungs clear to auscultation, symmetric chest rise CV: regular rate and rhythm Abd: soft, mild approp tender, nondistended. No cellulitis. No incisional hernia Ext: no edema, no calf tenderness Skin: no rash, no jaundice   Allergies as of 12/24/2020       Reactions   Feraheme [ferumoxytol] Shortness Of Breath   Iron Anaphylaxis   Intravenous iron only   Onion Itching, Swelling, Rash        Medication List     STOP taking these medications    traMADol 50 MG tablet Commonly known as: ULTRAM       TAKE these medications    acetaminophen 500 MG tablet Commonly known as: TYLENOL Take 2 tablets (1,000 mg total) by mouth every 6 (six) hours as needed for mild pain, fever or headache.   albuterol 108 (90 Base) MCG/ACT inhaler Commonly known as: VENTOLIN HFA Inhale 2 puffs into the lungs every 6 (six) hours as needed for wheezing or shortness of breath.   EPINEPHrine 0.3 mg/0.3 mL Soaj injection Commonly known as: EPI-PEN Inject 0.3 mg into the muscle as needed for anaphylaxis.   ergocalciferol 1.25 MG (50000 UT) capsule Commonly known as: VITAMIN D2 Take 1 capsule (50,000 Units total) by mouth once a week.  ferrous sulfate 325 (65 FE) MG tablet Take 1 tablet (325 mg total) by mouth 2 (two) times daily with a meal.   ibuprofen 200 MG tablet Commonly known as: Motrin IB Take 3 tablets (600 mg total) by mouth every 8 (eight) hours as needed.   ondansetron 4 MG disintegrating tablet Commonly known as: ZOFRAN-ODT Take 4 mg by mouth every 8 (eight) hours as needed for nausea or vomiting.   ONE-A-DAY WOMENS PO Take 1 tablet by mouth daily.   oxyCODONE 5 MG immediate release tablet Commonly known as: Oxy IR/ROXICODONE Take 1 tablet (5 mg total) by mouth every 6 (six)  hours as needed for moderate pain.       ASK your doctor about these medications    folic acid 1 MG tablet Commonly known as: FOLVITE Take 1 tablet (1 mg total) by mouth daily.          Follow-up Information     [facility] Follow up on 01/17/2021.   Specialty: General Surgery Why: 8:45 am, Arrive 30 minutes prior to your appointment time for paperwork and check in process Contact information: [address] [place] [address] [phone]                 Signed: Saverio Danker, [facility] 12/24/2020, 9:17 AM Please see Amion for pager number during day hours 7:00am-4:30pm, 7-11:30am on [place]. Redmond Pulling, MD, FACS General, Bariatric, & Minimally Invasive Surgery [facility], [place]

## 2020-12-24 DIAGNOSIS — Z79891 Long term (current) use of opiate analgesic: Secondary | ICD-10-CM | POA: Diagnosis not present

## 2020-12-24 DIAGNOSIS — K851 Biliary acute pancreatitis without necrosis or infection: Secondary | ICD-10-CM | POA: Diagnosis not present

## 2020-12-24 DIAGNOSIS — F319 Bipolar disorder, unspecified: Secondary | ICD-10-CM | POA: Diagnosis present

## 2020-12-24 DIAGNOSIS — E876 Hypokalemia: Secondary | ICD-10-CM | POA: Diagnosis present

## 2020-12-24 DIAGNOSIS — Z20822 Contact with and (suspected) exposure to covid-19: Secondary | ICD-10-CM | POA: Diagnosis present

## 2020-12-24 DIAGNOSIS — J45909 Unspecified asthma, uncomplicated: Secondary | ICD-10-CM | POA: Diagnosis present

## 2020-12-24 DIAGNOSIS — Z91018 Allergy to other foods: Secondary | ICD-10-CM | POA: Diagnosis not present

## 2020-12-24 DIAGNOSIS — Z79899 Other long term (current) drug therapy: Secondary | ICD-10-CM | POA: Diagnosis not present

## 2020-12-24 DIAGNOSIS — D509 Iron deficiency anemia, unspecified: Secondary | ICD-10-CM | POA: Diagnosis present

## 2020-12-24 DIAGNOSIS — E669 Obesity, unspecified: Secondary | ICD-10-CM | POA: Diagnosis present

## 2020-12-24 DIAGNOSIS — Z6841 Body Mass Index (BMI) 40.0 and over, adult: Secondary | ICD-10-CM | POA: Diagnosis not present

## 2020-12-24 DIAGNOSIS — K8010 Calculus of gallbladder with chronic cholecystitis without obstruction: Secondary | ICD-10-CM | POA: Diagnosis present

## 2020-12-24 DIAGNOSIS — Z888 Allergy status to other drugs, medicaments and biological substances status: Secondary | ICD-10-CM | POA: Diagnosis not present

## 2020-12-24 MED ORDER — IBUPROFEN 200 MG PO TABS: 600.0000 mg | ORAL_TABLET | Freq: Three times a day (TID) | ORAL | 0 refills | Status: DC | PRN

## 2020-12-24 MED ORDER — ACETAMINOPHEN 500 MG PO TABS
1000.0000 mg | ORAL_TABLET | Freq: Four times a day (QID) | ORAL | 0 refills | Status: DC | PRN
Start: 2020-12-24 — End: 2021-03-05

## 2020-12-24 MED ORDER — OXYCODONE HCL 5 MG PO TABS: 5.0000 mg | ORAL_TABLET | Freq: Four times a day (QID) | ORAL | 0 refills | Status: AC | PRN

## 2020-12-24 NOTE — Progress Notes (Signed)
Nurse reviewed discharge instructions with pt.  Pt verbalized understanding of discharge instructions, follow up appointments and new medication.  No concerns at time of discharge.

## 2020-12-26 ENCOUNTER — Encounter (HOSPITAL_COMMUNITY): Payer: Self-pay | Admitting: Surgery

## 2020-12-26 LAB — SURGICAL PATHOLOGY

## 2021-01-05 NOTE — Anesthesia Postprocedure Evaluation (Signed)
Anesthesia Post Note  Patient: Tracey Copeland  Procedure(s) Performed: LAPAROSCOPIC CHOLECYSTECTOMY WITH INTRAOPERATIVE CHOLANGIOGRAM     Patient location during evaluation: PACU Anesthesia Type: General Level of consciousness: awake and sedated Pain management: pain level controlled Vital Signs Assessment: post-procedure vital signs reviewed and stable Respiratory status: spontaneous breathing Cardiovascular status: stable Postop Assessment: no apparent nausea or vomiting Anesthetic complications: no   No notable events documented.  Last Vitals:  Vitals:   12/24/20 0630 12/24/20 1322  BP: (!) 100/52 105/69  Pulse: 74 91  Resp: 18 18  Temp: 36.5 C (!) 36.3 C  SpO2: 95% 97%    Last Pain:  Vitals:   12/24/20 1310  TempSrc:   PainSc: [place]

## 2021-02-16 ENCOUNTER — Emergency Department (HOSPITAL_BASED_OUTPATIENT_CLINIC_OR_DEPARTMENT_OTHER)
Admission: EM | Admit: 2021-02-16 | Discharge: 2021-02-16 | Disposition: A | Discharge status: Home / Self Care | Payer: Medicaid Other | Attending: Emergency Medicine | Admitting: Emergency Medicine

## 2021-02-16 ENCOUNTER — Other Ambulatory Visit: Payer: Self-pay

## 2021-02-16 ENCOUNTER — Encounter (HOSPITAL_BASED_OUTPATIENT_CLINIC_OR_DEPARTMENT_OTHER): Payer: Self-pay | Admitting: *Deleted

## 2021-02-16 DIAGNOSIS — N926 Irregular menstruation, unspecified: Secondary | ICD-10-CM | POA: Diagnosis not present

## 2021-02-16 DIAGNOSIS — J45909 Unspecified asthma, uncomplicated: Secondary | ICD-10-CM | POA: Diagnosis not present

## 2021-02-16 DIAGNOSIS — D649 Anemia, unspecified: Secondary | ICD-10-CM | POA: Insufficient documentation

## 2021-02-16 DIAGNOSIS — R5383 Other fatigue: Secondary | ICD-10-CM | POA: Diagnosis present

## 2021-02-16 DIAGNOSIS — D500 Iron deficiency anemia secondary to blood loss (chronic): Secondary | ICD-10-CM

## 2021-02-16 LAB — CBC WITH DIFFERENTIAL/PLATELET
Abs Immature Granulocytes: 0.03 10*3/uL (ref 0.00–0.07)
Basophils Absolute: 0 10*3/uL (ref 0.0–0.1)
Basophils Relative: 0 %
Eosinophils Absolute: 0.1 10*3/uL (ref 0.0–0.5)
Eosinophils Relative: 1 %
HCT: 26 % — ABNORMAL LOW (ref 36.0–46.0)
Hemoglobin: 7.2 g/dL — ABNORMAL LOW (ref 12.0–15.0)
Immature Granulocytes: 0 %
Lymphocytes Relative: 34 %
Lymphs Abs: 3 10*3/uL (ref 0.7–4.0)
MCH: 16.7 pg — ABNORMAL LOW (ref 26.0–34.0)
MCHC: 27.7 g/dL — ABNORMAL LOW (ref 30.0–36.0)
MCV: 60.2 fL — ABNORMAL LOW (ref 80.0–100.0)
Monocytes Absolute: 0.5 10*3/uL (ref 0.1–1.0)
Monocytes Relative: 6 %
Neutro Abs: 5.2 10*3/uL (ref 1.7–7.7)
Neutrophils Relative %: 59 %
Platelets: 403 10*3/uL — ABNORMAL HIGH (ref 150–400)
RBC: 4.32 MIL/uL (ref 3.87–5.11)
RDW: 18.1 % — ABNORMAL HIGH (ref 11.5–15.5)
WBC: 8.8 10*3/uL (ref 4.0–10.5)
nRBC: 0 % (ref 0.0–0.2)

## 2021-02-16 LAB — BASIC METABOLIC PANEL
Anion gap: 8 (ref 5–15)
BUN: 12 mg/dL (ref 6–20)
CO2: 25 mmol/L (ref 22–32)
Calcium: 8.5 mg/dL — ABNORMAL LOW (ref 8.9–10.3)
Chloride: 104 mmol/L (ref 98–111)
Creatinine, Ser: 0.82 mg/dL (ref 0.44–1.00)
GFR, Estimated: >60 mL/min (ref >60)
Glucose, Bld: 86 mg/dL (ref 70–99)
Potassium: 3.5 mmol/L (ref 3.5–5.1)
Sodium: 137 mmol/L (ref 135–145)

## 2021-02-16 LAB — URINALYSIS, MICROSCOPIC (REFLEX): RBC / HPF: >50 RBC/hpf (ref 0–5)

## 2021-02-16 LAB — PREGNANCY, URINE: Preg Test, Ur: NEGATIVE

## 2021-02-16 LAB — URINALYSIS, ROUTINE W REFLEX MICROSCOPIC
Bilirubin Urine: NEGATIVE
Glucose, UA: NEGATIVE mg/dL
Ketones, ur: NEGATIVE mg/dL
Leukocytes,Ua: NEGATIVE
Nitrite: NEGATIVE
Protein, ur: NEGATIVE mg/dL
Specific Gravity, Urine: 1.02 (ref 1.005–1.030)
pH: 6 (ref 5.0–8.0)

## 2021-02-16 NOTE — ED Notes (Signed)
Patient with no current c/o.  Resting quietly in bed.

## 2021-02-16 NOTE — Discharge Instructions (Addendum)
You are seen today for generalized fatigue.  You do remain anemic with a hemoglobin of 7.2.  Continue your iron at home.  Follow-up with your primary physician.  You may have undiagnosed sleep apnea.  If you continue to have daytime excessive sleepiness, you should have a sleep study set up.

## 2021-02-16 NOTE — ED Triage Notes (Addendum)
C/o weakness , " lethargic" x 8 hrs , pt reports hx anemia , vaginal bleeding x 2 months

## 2021-02-16 NOTE — ED Provider Notes (Signed)
[place] EMERGENCY DEPARTMENT Provider Note   CSN: [id number] Arrival date & time: 02/16/21  [id number]     History Chief Complaint  Patient presents with   Fatigue    Tracey Copeland is a 30 y.o. female.  HPI     This is a 30 year old female with a history of anemia, bipolar disorder who presents with increased fatigue.  Patient reports over the last day she has had increasing fatigue.  She states that she will just "fall asleep after getting up."  She reports generalized fatigue.  No recent illnesses or fevers.  She did have her gallbladder out in June.  She also reports that she has very irregular periods and has had daily vaginal bleeding since June.  She occasionally reports some lightheadedness.  No abdominal pain.  Does not believe that she is pregnant.  Denies headaches, focal weakness or numbness.  Patient does report that she snores at night but has never been told she has sleep apnea.  Past Medical History:  Diagnosis Date   Anemia    Asthma    Bipolar 1 disorder ([place])    Blood transfusion without reported diagnosis    Schizophrenia [facility])     Patient Active Problem List   Diagnosis Date Noted   Acute biliary pancreatitis 12/22/2020   IDA (iron deficiency anemia) 09/11/2018   Symptomatic anemia 08/24/2018   Menorrhagia 03/23/2018   Anemia 03/23/2018    Past Surgical History:  Procedure Laterality Date   CHOLECYSTECTOMY N/A 12/23/2020   Procedure: LAPAROSCOPIC CHOLECYSTECTOMY WITH INTRAOPERATIVE CHOLANGIOGRAM;  Surgeon: Coralie Keens, MD;  Location: [facility];  Service: General;  Laterality: N/A;   DILATION AND CURETTAGE OF UTERUS       OB History   No obstetric history on file.     No family history on file.  Social History   Tobacco Use   Smoking status: Never   Smokeless tobacco: Never  Vaping Use   Vaping Use: Never used  Substance Use Topics   Alcohol use: No   Drug use: No    Home Medications Prior to Admission medications    Medication Sig Start Date End Date Taking? Authorizing Provider  acetaminophen (TYLENOL) 500 MG tablet Take 2 tablets (1,000 mg total) by mouth every 6 (six) hours as needed for mild pain, fever or headache. 12/24/20   Greer Pickerel, MD  albuterol (PROVENTIL HFA;VENTOLIN HFA) 108 (90 Base) MCG/ACT inhaler Inhale 2 puffs into the lungs every 6 (six) hours as needed for wheezing or shortness of breath.    [provider]  EPINEPHrine 0.3 mg/0.3 mL IJ SOAJ injection Inject 0.3 mg into the muscle as needed for anaphylaxis. 01/10/19   [provider]  ergocalciferol (VITAMIN D2) 1.25 MG (50000 UT) capsule Take 1 capsule (50,000 Units total) by mouth once a week. 09/23/18   [place], Holli Humbles, NP  ferrous sulfate 325 (65 FE) MG tablet Take 1 tablet (325 mg total) by mouth 2 (two) times daily with a meal. 02/06/20   Poll[place], Gwenyth Allegra, MD  folic acid (FOLVITE) 1 MG tablet Take 1 tablet (1 mg total) by mouth daily. Patient not taking: Reported on 12/22/2020 02/29/20   Eliezer Bottom, NP  ibuprofen (MOTRIN IB) 200 MG tablet Take 3 tablets (600 mg total) by mouth every 8 (eight) hours as needed. 12/24/20 12/24/21  Greer Pickerel, MD  Multiple Vitamins-Calcium (ONE-A-DAY WOMENS PO) Take 1 tablet by mouth daily.    [provider]  ondansetron (ZOFRAN-ODT) 4 MG disintegrating  tablet Take 4 mg by mouth every 8 (eight) hours as needed for nausea or vomiting. 12/13/20   [provider]  oxyCODONE (OXY IR/ROXICODONE) 5 MG immediate release tablet Take 1 tablet (5 mg total) by mouth every 6 (six) hours as needed for moderate pain. 12/24/20   Greer Pickerel, MD    Allergies    Feraheme [ferumoxytol], Iron, and Onion  Review of Systems   Review of Systems  Constitutional:  Positive for fatigue. Negative for fever and unexpected weight change.  Respiratory:  Negative for shortness of breath.   Cardiovascular:  Negative for chest pain.  Gastrointestinal:  Negative for abdominal pain,  nausea and vomiting.  Neurological:  Positive for light-headedness. Negative for syncope, weakness and headaches.  All other systems reviewed and are negative.  Physical Exam Updated Vital Signs BP (!) 98/48 (BP Location: Right Arm)   Pulse 80   Temp 99.3 F (37.4 C) (Oral)   Resp 18   Ht 1.549 m ('5\' 1"'$ )   Wt 117 kg   LMP 12/22/2020   SpO2 98%   BMI 48.75 kg/m   Physical Exam Vitals and nursing note reviewed.  Constitutional:      Appearance: She is well-developed. She is obese. She is not ill-appearing.  HENT:     Head: Normocephalic and atraumatic.     Nose: Nose normal.     Mouth/Throat:     Mouth: Mucous membranes are moist.  Eyes:     Pupils: Pupils are equal, round, and reactive to light.  Cardiovascular:     Rate and Rhythm: Normal rate and regular rhythm.     Heart sounds: Normal heart sounds.  Pulmonary:     Effort: Pulmonary effort is normal. No respiratory distress.     Breath sounds: No wheezing.  Abdominal:     General: Bowel sounds are normal.     Palpations: Abdomen is soft.  Musculoskeletal:     Cervical back: Neck supple.     Right lower leg: No edema.     Left lower leg: No edema.  Skin:    General: Skin is warm and dry.  Neurological:     Mental Status: She is alert and oriented to person, place, and time.  Psychiatric:        Mood and Affect: Mood normal.    ED Results / Procedures / Treatments   Labs (all labs ordered are listed, but only abnormal results are displayed) Labs Reviewed  CBC WITH DIFFERENTIAL/PLATELET - Abnormal; Notable for the following components:      Result Value   Hemoglobin 7.2 (*)    HCT 26.0 (*)    MCV 60.2 (*)    MCH 16.7 (*)    MCHC 27.7 (*)    RDW 18.1 (*)    Platelets 403 (*)    All other components within normal limits  BASIC METABOLIC PANEL - Abnormal; Notable for the following components:   Calcium 8.5 (*)    All other components within normal limits  URINALYSIS, ROUTINE W REFLEX MICROSCOPIC -  Abnormal; Notable for the following components:   APPearance CLOUDY (*)    Hgb urine dipstick LARGE (*)    All other components within normal limits  URINALYSIS, MICROSCOPIC (REFLEX) - Abnormal; Notable for the following components:   Bacteria, UA RARE (*)    All other components within normal limits  PREGNANCY, URINE    EKG None  Radiology No results found.  Procedures Procedures   Medications Ordered in ED Medications -  No data to display  ED Course  I have reviewed the triage vital signs and the nursing notes.  Pertinent labs & imaging results that were available during my care of the patient were reviewed by me and considered in my medical decision making (see chart for details).    MDM Rules/Calculators/A&P                           Patient presents with generalized fatigue and worsening somnolence during the daytime over the last 24 hours.  She is nontoxic and vital signs are reassuring.  Reports history of chronic anemia.  Labs obtained.  Her physical exam is fairly benign.  Hemoglobin 7.2 on lab work.  Most recent baseline closer to 8.  Her transfusion threshold is 7 for the patient.  She continues to take iron at home.  This certainly could contribute to generalized fatigue.  Given her increased daytime somnolence, question undiagnosed sleep apnea given her weight and snoring at night.  Her work-up is otherwise reassuring.  Recommend follow-up with her primary physician.  Continue iron at home.  After history, exam, and medical workup I feel the patient has been appropriately medically screened and is safe for discharge home. Pertinent diagnoses were discussed with the patient. Patient was given return precautions.  Final Clinical Impression(s) / ED Diagnoses Final diagnoses:  Fatigue, unspecified type  Iron deficiency anemia due to chronic blood loss    Rx / DC Orders ED Discharge Orders     None        Dina Rich, Barbette Hair, MD 02/16/21 [phone]

## 2021-02-16 NOTE — ED Notes (Signed)
Reviewed discharge instructions with patient.  All questions answered.  Unable to sign for paperwork due to equipment.

## 2021-03-05 ENCOUNTER — Encounter (HOSPITAL_BASED_OUTPATIENT_CLINIC_OR_DEPARTMENT_OTHER): Payer: Self-pay | Admitting: Emergency Medicine

## 2021-03-05 ENCOUNTER — Other Ambulatory Visit: Payer: Self-pay

## 2021-03-05 ENCOUNTER — Emergency Department (HOSPITAL_BASED_OUTPATIENT_CLINIC_OR_DEPARTMENT_OTHER)
Admission: EM | Admit: 2021-03-05 | Discharge: 2021-03-05 | Disposition: A | Discharge status: Home / Self Care | Payer: Medicaid Other | Attending: Emergency Medicine | Admitting: Emergency Medicine

## 2021-03-05 DIAGNOSIS — J45909 Unspecified asthma, uncomplicated: Secondary | ICD-10-CM | POA: Insufficient documentation

## 2021-03-05 DIAGNOSIS — D500 Iron deficiency anemia secondary to blood loss (chronic): Secondary | ICD-10-CM | POA: Diagnosis not present

## 2021-03-05 DIAGNOSIS — N939 Abnormal uterine and vaginal bleeding, unspecified: Secondary | ICD-10-CM | POA: Insufficient documentation

## 2021-03-05 DIAGNOSIS — Z791 Long term (current) use of non-steroidal anti-inflammatories (NSAID): Secondary | ICD-10-CM | POA: Diagnosis not present

## 2021-03-05 DIAGNOSIS — R0602 Shortness of breath: Secondary | ICD-10-CM

## 2021-03-05 LAB — COMPREHENSIVE METABOLIC PANEL
ALT: 14 U/L (ref 0–44)
AST: 14 U/L — ABNORMAL LOW (ref 15–41)
Albumin: 3.5 g/dL (ref 3.5–5.0)
Alkaline Phosphatase: 112 U/L (ref 38–126)
Anion gap: 8 (ref 5–15)
BUN: 10 mg/dL (ref 6–20)
CO2: 27 mmol/L (ref 22–32)
Calcium: 8.5 mg/dL — ABNORMAL LOW (ref 8.9–10.3)
Chloride: 102 mmol/L (ref 98–111)
Creatinine, Ser: 0.89 mg/dL (ref 0.44–1.00)
GFR, Estimated: >60 mL/min (ref >60)
Glucose, Bld: 98 mg/dL (ref 70–99)
Potassium: 3.4 mmol/L — ABNORMAL LOW (ref 3.5–5.1)
Sodium: 137 mmol/L (ref 135–145)
Total Bilirubin: 0.4 mg/dL (ref 0.3–1.2)
Total Protein: 7.6 g/dL (ref 6.5–8.1)

## 2021-03-05 LAB — CBC WITH DIFFERENTIAL/PLATELET
Abs Immature Granulocytes: 0.02 10*3/uL (ref 0.00–0.07)
Basophils Absolute: 0 10*3/uL (ref 0.0–0.1)
Basophils Relative: 1 %
Eosinophils Absolute: 0 10*3/uL (ref 0.0–0.5)
Eosinophils Relative: 0 %
HCT: 25.8 % — ABNORMAL LOW (ref 36.0–46.0)
Hemoglobin: 7.3 g/dL — ABNORMAL LOW (ref 12.0–15.0)
Immature Granulocytes: 0 %
Lymphocytes Relative: 33 %
Lymphs Abs: 1.8 10*3/uL (ref 0.7–4.0)
MCH: 17.3 pg — ABNORMAL LOW (ref 26.0–34.0)
MCHC: 28.3 g/dL — ABNORMAL LOW (ref 30.0–36.0)
MCV: 61.3 fL — ABNORMAL LOW (ref 80.0–100.0)
Monocytes Absolute: 0.4 10*3/uL (ref 0.1–1.0)
Monocytes Relative: 7 %
Neutro Abs: 3.1 10*3/uL (ref 1.7–7.7)
Neutrophils Relative %: 59 %
Platelets: 491 10*3/uL — ABNORMAL HIGH (ref 150–400)
RBC: 4.21 MIL/uL (ref 3.87–5.11)
RDW: 21.4 % — ABNORMAL HIGH (ref 11.5–15.5)
Smear Review: NORMAL
WBC: 5.4 10*3/uL (ref 4.0–10.5)
nRBC: 0 % (ref 0.0–0.2)

## 2021-03-05 MED ORDER — AEROCHAMBER PLUS FLO-VU MISC
1.0000 | Freq: Once | Status: AC
  Administered 2021-03-05: 1
  Filled 2021-03-05: qty 1

## 2021-03-05 MED ORDER — ALBUTEROL SULFATE HFA 108 (90 BASE) MCG/ACT IN AERS
2.0000 | INHALATION_SPRAY | RESPIRATORY_TRACT | Status: DC | PRN
Start: 2021-03-05 — End: 2021-03-05
  Administered 2021-03-05: 2 via RESPIRATORY_TRACT
  Filled 2021-03-05: qty 6.7

## 2021-03-05 NOTE — ED Provider Notes (Signed)
[place] DEPT [facility] Provider Note: [name], MD, FACEP  CSN: [id number] MRN: [id number] ARRIVAL: 03/05/21 at [place]: [place]  03/05/21 5:09 AM Tracey Copeland is a 30 y.o. female who was seen 02/16/2021 for symptomatic anemia in the setting of heavy vaginal bleeding.  Her hemoglobin at the time was 7.2.  She has a history of anemia and normally gets transfused when her hemoglobin drops to about 7.  She was subsequently admitted to [facility] with a hemoglobin of 6.8 and she received 1 unit of packed red blood cells with an increase in her hemoglobin to 7.1.  She is having continued vaginal bleeding despite being placed on birth control pills by her OB/GYN.  She is also getting scheduled iron infusions.  She returns with an increase in fatigue, shortness of breath with exertion, decreased appetite and increased thirst.  She has also had cravings for ice and sweets.    Past Medical History:  Diagnosis Date   Anemia    Asthma    Bipolar 1 disorder ([place])    Blood transfusion without reported diagnosis    Schizophrenia [facility])     Past Surgical History:  Procedure Laterality Date   CHOLECYSTECTOMY N/A 12/23/2020   Procedure: LAPAROSCOPIC CHOLECYSTECTOMY WITH INTRAOPERATIVE CHOLANGIOGRAM;  Surgeon: Coralie Keens, MD;  Location: [facility];  Service: General;  Laterality: N/A;   DILATION AND CURETTAGE OF UTERUS      No family history on file.  Social History   Tobacco Use   Smoking status: Never   Smokeless tobacco: Never  Vaping Use   Vaping Use: Never used  Substance Use Topics   Alcohol use: No   Drug use: No    Prior to Admission medications   Medication Sig Start Date End Date Taking? Authorizing Provider  albuterol (PROVENTIL HFA;VENTOLIN HFA) 108 (90 Base) MCG/ACT inhaler Inhale 2 puffs into the lungs every 6 (six) hours as needed for wheezing or shortness of breath.     [provider]  EPINEPHrine 0.3 mg/0.3 mL IJ SOAJ injection Inject 0.3 mg into the muscle as needed for anaphylaxis. 01/10/19   [provider]  ergocalciferol (VITAMIN D2) 1.25 MG (50000 UT) capsule Take 1 capsule (50,000 Units total) by mouth once a week. 09/23/18   [place], Holli Humbles, NP  ferrous sulfate 325 (65 FE) MG tablet Take 1 tablet (325 mg total) by mouth 2 (two) times daily with a meal. 02/06/20   [place], Gwenyth Allegra, MD  ibuprofen (MOTRIN IB) 200 MG tablet Take 3 tablets (600 mg total) by mouth every 8 (eight) hours as needed. 12/24/20 12/24/21  Greer Pickerel, MD  Multiple Vitamins-Calcium (ONE-A-DAY WOMENS PO) Take 1 tablet by mouth daily.    [provider]  ondansetron (ZOFRAN-ODT) 4 MG disintegrating tablet Take 4 mg by mouth every 8 (eight) hours as needed for nausea or vomiting. 12/13/20   [provider]  oxyCODONE (OXY IR/ROXICODONE) 5 MG immediate release tablet Take 1 tablet (5 mg total) by mouth every 6 (six) hours as needed for moderate pain. 12/24/20   Greer Pickerel, MD    Allergies Feraheme [ferumoxytol], Iron, and Onion   REVIEW OF SYSTEMS  Negative except as noted here or in the History of Present Illness.   PHYSICAL EXAMINATION  Initial Vital Signs Blood pressure 114/70, pulse 83, temperature 98.7 F (37.1 C), temperature source Oral, resp. rate 18, height '5\' 1"'$  (1.549 m), weight  117 kg, last menstrual period 03/05/2021, SpO2 99 %.  Examination General: Well-developed, high BMI female in no acute distress; appearance consistent with age of record HENT: normocephalic; atraumatic Eyes: pupils equal, round and reactive to light; extraocular muscles intact Neck: supple Heart: regular rate and rhythm Lungs: clear to auscultation bilaterally Abdomen: soft; nondistended; nontender; bowel sounds present Extremities: No deformity; full range of motion Neurologic: Awake, alert and oriented; motor function intact in all extremities and  symmetric; no facial droop Skin: Warm and dry Psychiatric: Normal mood and affect   RESULTS  Summary of this visit's results, reviewed and interpreted by myself:   EKG Interpretation  Date/Time:  Sunday March 05 2021 03:12:10 EDT Ventricular Rate:  98 PR Interval:  146 QRS Duration: 78 QT Interval:  356 QTC Calculation: 454 R Axis:   61 Text Interpretation: Normal sinus rhythm Normal ECG No significant change was found Confirmed by Melannie Metzner, Jenny Reichmann [phone]) on 03/05/2021 3:41:28 AM       Laboratory Studies: Results for orders placed or performed during the hospital encounter of 03/05/21 (from the past 24 hour(s))  CBC with Differential     Status: Abnormal   Collection Time: 03/05/21  5:02 AM  Result Value Ref Range   WBC 5.4 4.0 - 10.5 K/uL   RBC 4.21 3.87 - 5.11 MIL/uL   Hemoglobin 7.3 (L) 12.0 - 15.0 g/dL   HCT 25.8 (L) 36.0 - 46.0 %   MCV 61.3 (L) 80.0 - 100.0 fL   MCH 17.3 (L) 26.0 - 34.0 pg   MCHC 28.3 (L) 30.0 - 36.0 g/dL   RDW 21.4 (H) 11.5 - 15.5 %   Platelets 491 (H) 150 - 400 K/uL   nRBC 0.0 0.0 - 0.2 %   Neutrophils Relative % 59 %   Neutro Abs 3.1 1.7 - 7.7 K/uL   Lymphocytes Relative 33 %   Lymphs Abs 1.8 0.7 - 4.0 K/uL   Monocytes Relative 7 %   Monocytes Absolute 0.4 0.1 - 1.0 K/uL   Eosinophils Relative 0 %   Eosinophils Absolute 0.0 0.0 - 0.5 K/uL   Basophils Relative 1 %   Basophils Absolute 0.0 0.0 - 0.1 K/uL   WBC Morphology MORPHOLOGY UNREMARKABLE    Smear Review Normal platelet morphology    Immature Granulocytes 0 %   Abs Immature Granulocytes 0.02 0.00 - 0.07 K/uL   Tear Drop Cells PRESENT    Polychromasia PRESENT    Ovalocytes PRESENT   Comprehensive metabolic panel     Status: Abnormal   Collection Time: 03/05/21  5:02 AM  Result Value Ref Range   Sodium 137 135 - 145 mmol/L   Potassium 3.4 (L) 3.5 - 5.1 mmol/L   Chloride 102 98 - 111 mmol/L   CO2 27 22 - 32 mmol/L   Glucose, Bld 98 70 - 99 mg/dL   BUN 10 6 - 20 mg/dL   Creatinine,  Ser 0.89 0.44 - 1.00 mg/dL   Calcium 8.5 (L) 8.9 - 10.3 mg/dL   Total Protein 7.6 6.5 - 8.1 g/dL   Albumin 3.5 3.5 - 5.0 g/dL   AST 14 (L) 15 - 41 U/L   ALT 14 0 - 44 U/L   Alkaline Phosphatase 112 38 - 126 U/L   Total Bilirubin 0.4 0.3 - 1.2 mg/dL   GFR, Estimated >60 >60 mL/min   Anion gap 8 5 - 15   Imaging Studies: No results found.  ED COURSE and MDM  Nursing notes, initial and subsequent vitals  signs, including pulse oximetry, reviewed and interpreted by myself.  Vitals:   03/05/21 0309 03/05/21 0309 03/05/21 0505  BP:  (!) 125/102 114/70  Pulse:  90 83  Resp:  18 18  Temp:  98.7 F (37.1 C)   TempSrc:  Oral   SpO2: 100% 100% 99%  Weight: 117 kg    Height: '5\' 1"'$  (1.549 m)     Medications  albuterol (VENTOLIN HFA) 108 (90 Base) MCG/ACT inhaler 2 puff (has no administration in time range)  aerochamber plus with mask device 1 each (has no administration in time range)   Patient's hemoglobin is actually improved since discharge from [facility].  I do not believe the patient needs to be admitted at this time.  She does have a history of asthma and is out of her inhaler so we will provide one for her.  She was advised to contact her OB/GYN regarding the persistent bleeding.  She is scheduled for another iron infusion in 3 days.  PROCEDURES  Procedures   ED DIAGNOSES     ICD-10-CM   1. Shortness of breath  R06.02     2. Anemia due to blood loss, chronic  D50.0     3. Abnormal vaginal bleeding  N93.9          Jadien Lehigh, Jenny Reichmann, MD 03/05/21 [phone]

## 2021-03-05 NOTE — ED Triage Notes (Signed)
Reports continued sob and weakness due to having low blood.  Received a transfusion last week.

## 2021-08-30 ENCOUNTER — Other Ambulatory Visit: Payer: Self-pay

## 2021-08-30 ENCOUNTER — Emergency Department (HOSPITAL_BASED_OUTPATIENT_CLINIC_OR_DEPARTMENT_OTHER)
Admission: EM | Admit: 2021-08-30 | Discharge: 2021-08-31 | Disposition: A | Discharge status: Home / Self Care | Payer: Medicaid Other | Attending: Emergency Medicine | Admitting: Emergency Medicine

## 2021-08-30 ENCOUNTER — Encounter (HOSPITAL_BASED_OUTPATIENT_CLINIC_OR_DEPARTMENT_OTHER): Payer: Self-pay

## 2021-08-30 DIAGNOSIS — K648 Other hemorrhoids: Secondary | ICD-10-CM | POA: Insufficient documentation

## 2021-08-30 DIAGNOSIS — D649 Anemia, unspecified: Secondary | ICD-10-CM | POA: Insufficient documentation

## 2021-08-30 DIAGNOSIS — E58 Dietary calcium deficiency: Secondary | ICD-10-CM | POA: Diagnosis not present

## 2021-08-30 DIAGNOSIS — E878 Other disorders of electrolyte and fluid balance, not elsewhere classified: Secondary | ICD-10-CM | POA: Insufficient documentation

## 2021-08-30 DIAGNOSIS — E871 Hypo-osmolality and hyponatremia: Secondary | ICD-10-CM | POA: Insufficient documentation

## 2021-08-30 DIAGNOSIS — R197 Diarrhea, unspecified: Secondary | ICD-10-CM | POA: Insufficient documentation

## 2021-08-30 DIAGNOSIS — R770 Abnormality of albumin: Secondary | ICD-10-CM | POA: Insufficient documentation

## 2021-08-30 LAB — URINALYSIS, ROUTINE W REFLEX MICROSCOPIC
Bilirubin Urine: NEGATIVE
Glucose, UA: NEGATIVE mg/dL
Ketones, ur: NEGATIVE mg/dL
Leukocytes,Ua: NEGATIVE
Nitrite: NEGATIVE
Protein, ur: 100 mg/dL — AB
Specific Gravity, Urine: >=1.03 (ref 1.005–1.030)
pH: 6 (ref 5.0–8.0)

## 2021-08-30 LAB — CBC
HCT: 30.6 % — ABNORMAL LOW (ref 36.0–46.0)
Hemoglobin: 8.1 g/dL — ABNORMAL LOW (ref 12.0–15.0)
MCH: 15.3 pg — ABNORMAL LOW (ref 26.0–34.0)
MCHC: 26.5 g/dL — ABNORMAL LOW (ref 30.0–36.0)
MCV: 57.8 fL — ABNORMAL LOW (ref 80.0–100.0)
Platelets: 422 10*3/uL — ABNORMAL HIGH (ref 150–400)
RBC: 5.29 MIL/uL — ABNORMAL HIGH (ref 3.87–5.11)
RDW: 20.4 % — ABNORMAL HIGH (ref 11.5–15.5)
WBC: 10.3 10*3/uL (ref 4.0–10.5)
nRBC: 0 % (ref 0.0–0.2)

## 2021-08-30 LAB — PREGNANCY, URINE: Preg Test, Ur: NEGATIVE

## 2021-08-30 LAB — COMPREHENSIVE METABOLIC PANEL
ALT: 13 U/L (ref 0–44)
AST: 11 U/L — ABNORMAL LOW (ref 15–41)
Albumin: 3.2 g/dL — ABNORMAL LOW (ref 3.5–5.0)
Alkaline Phosphatase: 77 U/L (ref 38–126)
Anion gap: 7 (ref 5–15)
BUN: 11 mg/dL (ref 6–20)
CO2: 21 mmol/L — ABNORMAL LOW (ref 22–32)
Calcium: 8.7 mg/dL — ABNORMAL LOW (ref 8.9–10.3)
Chloride: 106 mmol/L (ref 98–111)
Creatinine, Ser: 0.86 mg/dL (ref 0.44–1.00)
GFR, Estimated: >60 mL/min (ref >60)
Glucose, Bld: 99 mg/dL (ref 70–99)
Potassium: 3.5 mmol/L (ref 3.5–5.1)
Sodium: 134 mmol/L — ABNORMAL LOW (ref 135–145)
Total Bilirubin: 0.3 mg/dL (ref 0.3–1.2)
Total Protein: 7.5 g/dL (ref 6.5–8.1)

## 2021-08-30 LAB — URINALYSIS, MICROSCOPIC (REFLEX): RBC / HPF: >50 RBC/hpf (ref 0–5)

## 2021-08-30 LAB — LIPASE, BLOOD: Lipase: 38 U/L (ref 11–51)

## 2021-08-30 LAB — OCCULT BLOOD X 1 CARD TO LAB, STOOL: Fecal Occult Bld: POSITIVE — AB

## 2021-08-30 MED ORDER — LACTATED RINGERS IV BOLUS
1000.0000 mL | Freq: Once | INTRAVENOUS | Status: AC
  Administered 2021-08-30: 1000 mL via INTRAVENOUS

## 2021-08-30 NOTE — ED Triage Notes (Signed)
Pt c/o abd pain, diarrhea started after eating fast food this am-NAD-steady gait

## 2021-08-31 MED ORDER — HYDROCORTISONE 1 % EX CREA: TOPICAL_CREAM | CUTANEOUS | 0 refills | Status: AC

## 2021-08-31 NOTE — ED Provider Notes (Signed)
[place] EMERGENCY DEPARTMENT Provider Note   CSN: [id number] Arrival date & time: 08/30/21  1943     History  Chief Complaint  Patient presents with   Abdominal Pain    Tracey Copeland is a 31 y.o. female with history of abnormal vaginal bleeding presents emerged department for evaluation of her diarrhea starting today.  Patient reports she had a bacon and cheese biscuit and apple juice from [employer] this morning went to bed for about an hour and woke up with diarrhea.  She reports that she has had around 7 episodes in the past 12 hours that has been watery and light green without any blackness or blood to it.  She did however noticed a small amount of blood on the tissue paper when wiping and was having some rectal pain.  She denies any fevers, nausea, vomiting, or abdominal pain.  She reports that she occasionally gets this after eating greasy food.   Abdominal Pain Associated symptoms: diarrhea   Associated symptoms: no nausea and no vomiting       Home Medications Prior to Admission medications   Medication Sig Start Date End Date Taking? Authorizing Provider  hydrocortisone cream 1 % Apply to affected area 2 times daily 08/31/21  Yes Sherrell Puller, PA-C  albuterol (PROVENTIL HFA;VENTOLIN HFA) 108 (90 Base) MCG/ACT inhaler Inhale 2 puffs into the lungs every 6 (six) hours as needed for wheezing or shortness of breath.    [provider]  EPINEPHrine 0.3 mg/0.3 mL IJ SOAJ injection Inject 0.3 mg into the muscle as needed for anaphylaxis. 01/10/19   [provider]  ergocalciferol (VITAMIN D2) 1.25 MG (50000 UT) capsule Take 1 capsule (50,000 Units total) by mouth once a week. 09/23/18   Celso Amy, NP  ferrous sulfate 325 (65 FE) MG tablet Take 1 tablet (325 mg total) by mouth 2 (two) times daily with a meal. 02/06/20   [place], Gwenyth Allegra, MD  ibuprofen (MOTRIN IB) 200 MG tablet Take 3 tablets (600 mg total) by mouth every 8 (eight) hours  as needed. 12/24/20 12/24/21  Greer Pickerel, MD  Multiple Vitamins-Calcium (ONE-A-DAY WOMENS PO) Take 1 tablet by mouth daily.    [provider]  ondansetron (ZOFRAN-ODT) 4 MG disintegrating tablet Take 4 mg by mouth every 8 (eight) hours as needed for nausea or vomiting. 12/13/20   [provider]  oxyCODONE (OXY IR/ROXICODONE) 5 MG immediate release tablet Take 1 tablet (5 mg total) by mouth every 6 (six) hours as needed for moderate pain. 12/24/20   Greer Pickerel, MD      Allergies    Feraheme [ferumoxytol], Iron, and Onion    Review of Systems   Review of Systems  Gastrointestinal:  Positive for anal bleeding, diarrhea and rectal pain. Negative for abdominal pain, nausea and vomiting.   See HPI  Physical Exam Updated Vital Signs BP 108/74    Pulse 74    Temp 99 F (37.2 C) (Oral)    Resp 18    Ht 5\' 1"  (1.549 m)    Wt 106.6 kg    SpO2 100%    BMI 44.40 kg/m  Physical Exam Vitals and nursing note reviewed. Exam conducted with a chaperone present Vicente Males, RN).  Constitutional:      General: She is not in acute distress.    Appearance: Normal appearance. She is not ill-appearing or toxic-appearing.  HENT:     Head: Normocephalic and atraumatic.  Eyes:     General: No scleral  icterus. Cardiovascular:     Rate and Rhythm: Normal rate and regular rhythm.  Pulmonary:     Effort: Pulmonary effort is normal.     Breath sounds: Normal breath sounds. No wheezing.  Abdominal:     General: Bowel sounds are normal.     Palpations: Abdomen is soft.     Tenderness: There is no abdominal tenderness. There is no guarding or rebound.  Genitourinary:    Rectum: Anal fissure and internal hemorrhoid present.  Musculoskeletal:        General: No deformity.     Cervical back: Normal range of motion.  Skin:    General: Skin is warm and dry.  Neurological:     General: No focal deficit present.     Mental Status: She is alert. Mental status is at baseline.    ED Results /  Procedures / Treatments   Labs (all labs ordered are listed, but only abnormal results are displayed) Labs Reviewed  COMPREHENSIVE METABOLIC PANEL - Abnormal; Notable for the following components:      Result Value   Sodium 134 (*)    CO2 21 (*)    Calcium 8.7 (*)    Albumin 3.2 (*)    AST 11 (*)    All other components within normal limits  CBC - Abnormal; Notable for the following components:   RBC 5.29 (*)    Hemoglobin 8.1 (*)    HCT 30.6 (*)    MCV 57.8 (*)    MCH 15.3 (*)    MCHC 26.5 (*)    RDW 20.4 (*)    Platelets 422 (*)    All other components within normal limits  URINALYSIS, ROUTINE W REFLEX MICROSCOPIC - Abnormal; Notable for the following components:   APPearance CLOUDY (*)    Hgb urine dipstick LARGE (*)    Protein, ur 100 (*)    All other components within normal limits  URINALYSIS, MICROSCOPIC (REFLEX) - Abnormal; Notable for the following components:   Bacteria, UA FEW (*)    All other components within normal limits  OCCULT BLOOD X 1 CARD TO LAB, STOOL - Abnormal; Notable for the following components:   Fecal Occult Bld POSITIVE (*)    All other components within normal limits  LIPASE, BLOOD  PREGNANCY, URINE    EKG None  Radiology No results found.  Procedures Procedures   Medications Ordered in ED Medications  lactated ringers bolus 1,000 mL (0 mLs Intravenous Stopped 08/30/21 2346)    ED Course/ Medical Decision Making/ A&P                           Medical Decision Making Amount and/or Complexity of Data Reviewed Labs: ordered.   31 year old female presents emerged department for evaluation of 7 episodes of diarrhea over the past 12 hours.  Differential diagnosis includes was not limited to osmotic diarrhea, viral gastroenteritis, cholecystitis, colitis, diverticulitis hemorrhoids, anal fissures.  Vital signs are unremarkable.  Patient normotensive, afebrile, normal pulse rate, satting well on room air without any increased work of  breathing.  Physical exam shows a soft, nondistended, nontender abdomen with normal active bowel sounds.  On GU exam, the patient has a palpable internal hemorrhoid and a few anal fissures noted.   At this time, low suspicion for any colitis or diverticulitis as she is not having blood in the toilet but only small amount of blood per the paper.  Low suspicion for any cholecystitis  as the patient has had her gallbladder removed.  This might be osmotic diarrhea due to the sugary apple juice.  Given the patient's benign abdominal exam, nontender, nondistended, soft, with normal active bowel sounds, I do not think imaging is warranted at this time as the patient is also very well-appearing.  Labs ordered in triage.  CBC shows no leukocytosis although patient does have significant anemia at 8.1 hemoglobin with hematocrit of 30.6 which is improved from her hemoglobin of 7.3 from 6 months ago.  Negative pregnancy test.  CMP shows mildly decreased sodium 134.  Mildly decreased bicarb at 21.  Mildly decreased calcium at 8.7.  Mildly low albumin and AST.  No other electrolyte abnormalities.  Lipase is normal.  Urinalysis shows cloudy urine with large blood and 100 protein with reflex showing few bacteria but 0-5 white blood cells.  Greater than 50 red blood cells seen.  Patient is currently on her cycle.  Not consistent with UTI.  Fecal occult positive although more likely because of the palpable internal hemorrhoid and anal fissures.  The patient was given 1 L of LR while in the emergency department.  On reevaluation, the patient reports she is feeling better after the fluid and has not had another episode of diarrhea since being here.  Abdominal exam still unremarkable.   I informed her that her bleeding is likely due to the anal fissures and internal hemorrhoid.  Recommended that she follow-up with her PCP for further evaluation on this.  Discussed a bland diet with her to use for easier digestion.  Recommended  return precautions to continue diarrhea for more than 48 hours or any abdominal pain, fever, or vomiting.  Patient agrees with plan.  Patient is stable and being discharged home in good condition.  ED Diagnoses Final diagnoses:  Diarrhea, unspecified type    Rx / DC Orders ED Discharge Orders          Ordered    hydrocortisone cream 1 %        08/31/21 0005              Sherrell Puller, PA-C 09/02/21 2140    Lucrezia Starch, MD 09/03/21 [phone]

## 2021-08-31 NOTE — Discharge Instructions (Addendum)
You were seen here today for evaluation of you diarrhea and rectal pain. Your lab work showed your chronic stable anemia and some concentrated urine, otherwise normal. We have given you fluids to re-hydrate you. I would stick to bland foods that is easier to digest. More information on bland diets and what to eat with diarrhea included in the discharge paperwork. Because of you wiping your bottom often with the diarrhea, it is irritated. I have prescribed you  a topical steroid for you to apply to the area twice daily as needed. If you are have continued diarrhea for more than 48 hours, abdominal pain, fever, or vomiting, please return to the nearest ER for re-evaluation.
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
## 2021-10-16 ENCOUNTER — Emergency Department (HOSPITAL_BASED_OUTPATIENT_CLINIC_OR_DEPARTMENT_OTHER): Payer: Medicaid Other

## 2021-10-16 ENCOUNTER — Other Ambulatory Visit: Payer: Self-pay

## 2021-10-16 ENCOUNTER — Emergency Department (HOSPITAL_BASED_OUTPATIENT_CLINIC_OR_DEPARTMENT_OTHER)
Admission: EM | Admit: 2021-10-16 | Discharge: 2021-10-17 | Disposition: A | Discharge status: Home / Self Care | Payer: Medicaid Other | Attending: Emergency Medicine | Admitting: Emergency Medicine

## 2021-10-16 ENCOUNTER — Encounter (HOSPITAL_BASED_OUTPATIENT_CLINIC_OR_DEPARTMENT_OTHER): Payer: Self-pay

## 2021-10-16 DIAGNOSIS — W19XXXA Unspecified fall, initial encounter: Secondary | ICD-10-CM | POA: Insufficient documentation

## 2021-10-16 DIAGNOSIS — D649 Anemia, unspecified: Secondary | ICD-10-CM | POA: Diagnosis not present

## 2021-10-16 DIAGNOSIS — N938 Other specified abnormal uterine and vaginal bleeding: Secondary | ICD-10-CM | POA: Diagnosis not present

## 2021-10-16 DIAGNOSIS — M545 Low back pain, unspecified: Secondary | ICD-10-CM | POA: Insufficient documentation

## 2021-10-16 HISTORY — DX: Benign neoplasm of connective and other soft tissue, unspecified: D21.9

## 2021-10-16 LAB — CBC WITH DIFFERENTIAL/PLATELET
Abs Immature Granulocytes: 0.04 10*3/uL (ref 0.00–0.07)
Basophils Absolute: 0 10*3/uL (ref 0.0–0.1)
Basophils Relative: 0 %
Eosinophils Absolute: 0.1 10*3/uL (ref 0.0–0.5)
Eosinophils Relative: 1 %
HCT: 27.8 % — ABNORMAL LOW (ref 36.0–46.0)
Hemoglobin: 7.3 g/dL — ABNORMAL LOW (ref 12.0–15.0)
Immature Granulocytes: 0 %
Lymphocytes Relative: 34 %
Lymphs Abs: 3.2 10*3/uL (ref 0.7–4.0)
MCH: 15.2 pg — ABNORMAL LOW (ref 26.0–34.0)
MCHC: 26.3 g/dL — ABNORMAL LOW (ref 30.0–36.0)
MCV: 57.8 fL — ABNORMAL LOW (ref 80.0–100.0)
Monocytes Absolute: 0.6 10*3/uL (ref 0.1–1.0)
Monocytes Relative: 6 %
Neutro Abs: 5.4 10*3/uL (ref 1.7–7.7)
Neutrophils Relative %: 59 %
Platelets: 484 10*3/uL — ABNORMAL HIGH (ref 150–400)
RBC: 4.81 MIL/uL (ref 3.87–5.11)
RDW: 20.5 % — ABNORMAL HIGH (ref 11.5–15.5)
WBC: 9.3 10*3/uL (ref 4.0–10.5)
nRBC: 0 % (ref 0.0–0.2)

## 2021-10-16 LAB — PREGNANCY, URINE: Preg Test, Ur: NEGATIVE

## 2021-10-16 LAB — URINALYSIS, ROUTINE W REFLEX MICROSCOPIC
Bilirubin Urine: NEGATIVE
Glucose, UA: NEGATIVE mg/dL
Hgb urine dipstick: NEGATIVE
Ketones, ur: NEGATIVE mg/dL
Leukocytes,Ua: NEGATIVE
Nitrite: NEGATIVE
Protein, ur: NEGATIVE mg/dL
Specific Gravity, Urine: 1.025 (ref 1.005–1.030)
pH: 6.5 (ref 5.0–8.0)

## 2021-10-16 LAB — BASIC METABOLIC PANEL
Anion gap: 7 (ref 5–15)
BUN: 14 mg/dL (ref 6–20)
CO2: 24 mmol/L (ref 22–32)
Calcium: 8.8 mg/dL — ABNORMAL LOW (ref 8.9–10.3)
Chloride: 107 mmol/L (ref 98–111)
Creatinine, Ser: 0.91 mg/dL (ref 0.44–1.00)
GFR, Estimated: >60 mL/min (ref >60)
Glucose, Bld: 91 mg/dL (ref 70–99)
Potassium: 3.8 mmol/L (ref 3.5–5.1)
Sodium: 138 mmol/L (ref 135–145)

## 2021-10-16 MED ORDER — IBUPROFEN 800 MG PO TABS
800.0000 mg | ORAL_TABLET | Freq: Once | ORAL | Status: AC
  Administered 2021-10-17: 800 mg via ORAL
  Filled 2021-10-16: qty 1

## 2021-10-16 NOTE — ED Triage Notes (Signed)
Patient states she has pmh or uterine fibroids and has been bleeding since January.  Bleeding increased today with clots, requiring adult depends. ?

## 2021-10-16 NOTE — ED Notes (Signed)
Patient transported to X-ray 
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
## 2021-10-17 MED ORDER — IBUPROFEN 600 MG PO TABS: 600.0000 mg | ORAL_TABLET | Freq: Three times a day (TID) | ORAL | 0 refills | Status: AC | PRN

## 2021-10-17 NOTE — ED Notes (Signed)
Patient discharged to home.  All discharge instructions reviewed.  Patient verbalized understanding via teachback method.  VS WDL.  Respirations even and unlabored.  Ambulatory out of ED.   °

## 2021-10-17 NOTE — ED Notes (Signed)
Patient returned from X-ray 

## 2022-02-07 ENCOUNTER — Emergency Department (HOSPITAL_BASED_OUTPATIENT_CLINIC_OR_DEPARTMENT_OTHER)
Admission: EM | Admit: 2022-02-07 | Discharge: 2022-02-07 | Disposition: A | Discharge status: Home / Self Care | Payer: Medicaid Other | Attending: Emergency Medicine | Admitting: Emergency Medicine

## 2022-02-07 ENCOUNTER — Encounter (HOSPITAL_BASED_OUTPATIENT_CLINIC_OR_DEPARTMENT_OTHER): Payer: Self-pay | Admitting: Emergency Medicine

## 2022-02-07 DIAGNOSIS — N939 Abnormal uterine and vaginal bleeding, unspecified: Secondary | ICD-10-CM | POA: Diagnosis not present

## 2022-02-07 DIAGNOSIS — D649 Anemia, unspecified: Secondary | ICD-10-CM | POA: Diagnosis not present

## 2022-02-07 LAB — CBC WITH DIFFERENTIAL/PLATELET
Abs Immature Granulocytes: 0.03 10*3/uL (ref 0.00–0.07)
Basophils Absolute: 0 10*3/uL (ref 0.0–0.1)
Basophils Relative: 0 %
Eosinophils Absolute: 0.1 10*3/uL (ref 0.0–0.5)
Eosinophils Relative: 1 %
HCT: 34.3 % — ABNORMAL LOW (ref 36.0–46.0)
Hemoglobin: 9.5 g/dL — ABNORMAL LOW (ref 12.0–15.0)
Immature Granulocytes: 0 %
Lymphocytes Relative: 24 %
Lymphs Abs: 2.3 10*3/uL (ref 0.7–4.0)
MCH: 16.7 pg — ABNORMAL LOW (ref 26.0–34.0)
MCHC: 27.7 g/dL — ABNORMAL LOW (ref 30.0–36.0)
MCV: 60.3 fL — ABNORMAL LOW (ref 80.0–100.0)
Monocytes Absolute: 0.7 10*3/uL (ref 0.1–1.0)
Monocytes Relative: 7 %
Neutro Abs: 6.5 10*3/uL (ref 1.7–7.7)
Neutrophils Relative %: 68 %
Platelets: 500 10*3/uL — ABNORMAL HIGH (ref 150–400)
RBC: 5.69 MIL/uL — ABNORMAL HIGH (ref 3.87–5.11)
RDW: 23.2 % — ABNORMAL HIGH (ref 11.5–15.5)
WBC: 9.5 10*3/uL (ref 4.0–10.5)
nRBC: 0 % (ref 0.0–0.2)

## 2022-02-07 LAB — PREGNANCY, URINE: Preg Test, Ur: NEGATIVE

## 2022-02-07 NOTE — ED Notes (Signed)
Pt has hx of intrauterine bleeding, has needed blood transfusion in the past.  States she has ben passing clots and bleeding has been heavy.   C/O cramping 8/10

## 2022-02-07 NOTE — Discharge Instructions (Signed)
Your hemoglobin today was 9.5.  Follow-up with your gynecologist next week for reevaluation, continue taking your current birth control.  If you were to lose consciousness, have severe lightheadedness or dizziness she return back to ED to recheck your blood counts.

## 2022-02-07 NOTE — ED Provider Notes (Signed)
[facility] Provider Note   CSN: [id number] Arrival date & time: 02/07/22  1924     History  Chief Complaint  Patient presents with   Vaginal Bleeding    Tracey Copeland is a 31 y.o. female.   Vaginal Bleeding   Patient presents due to vaginal bleeding.  This has been going on since early June, bright red blood with some clots.  She has a history of the same which has previously required transfusion.  She is on oral birth control, not currently trying to get pregnant and not undergoing IVF.  She is not on any blood thinners.  She has some intermittent cramping, denies any severe abdominal pain, syncope.  She feels lightheaded when she goes from sitting to standing but is otherwise asymptomatic.  Patient is accompanied by her wife who is pregnant with their second child.  Home Medications Prior to Admission medications   Medication Sig Start Date End Date Taking? Authorizing Provider  albuterol (PROVENTIL HFA;VENTOLIN HFA) 108 (90 Base) MCG/ACT inhaler Inhale 2 puffs into the lungs every 6 (six) hours as needed for wheezing or shortness of breath.    [provider]  EPINEPHrine 0.3 mg/0.3 mL IJ SOAJ injection Inject 0.3 mg into the muscle as needed for anaphylaxis. 01/10/19   [provider]  ergocalciferol (VITAMIN D2) 1.25 MG (50000 UT) capsule Take 1 capsule (50,000 Units total) by mouth once a week. 09/23/18   Celso Amy, NP  ferrous sulfate 325 (65 FE) MG tablet Take 1 tablet (325 mg total) by mouth 2 (two) times daily with a meal. 02/06/20   Pollina, Gwenyth Allegra, MD  hydrocortisone cream 1 % Apply to affected area 2 times daily 08/31/21   Sherrell Puller, PA-C  Multiple Vitamins-Calcium (ONE-A-DAY WOMENS PO) Take 1 tablet by mouth daily.    [provider]  ondansetron (ZOFRAN-ODT) 4 MG disintegrating tablet Take 4 mg by mouth every 8 (eight) hours as needed for nausea or vomiting. 12/13/20   [provider]   oxyCODONE (OXY IR/ROXICODONE) 5 MG immediate release tablet Take 1 tablet (5 mg total) by mouth every 6 (six) hours as needed for moderate pain. 12/24/20   Greer Pickerel, MD      Allergies    Feraheme [ferumoxytol], Iron, and Onion    Review of Systems   Review of Systems  Genitourinary:  Positive for vaginal bleeding.    Physical Exam Updated Vital Signs BP (!) 102/50 (BP Location: Right Arm)   Pulse 77   Temp 98.6 F (37 C) (Oral)   Resp 16   Ht '5\' 1"'$  (1.549 m)   Wt 117.9 kg   LMP 12/08/2021   SpO2 100%   BMI 49.13 kg/m  Physical Exam Vitals and nursing note reviewed. Exam conducted with a chaperone present.  Constitutional:      Appearance: Normal appearance.  HENT:     Head: Normocephalic and atraumatic.     Mouth/Throat:     Comments: Pale mucous membrane Eyes:     General: No scleral icterus.       Right eye: No discharge.        Left eye: No discharge.     Extraocular Movements: Extraocular movements intact.     Pupils: Pupils are equal, round, and reactive to light.     Comments: Pale conjunctive a  Cardiovascular:     Rate and Rhythm: Normal rate and regular rhythm.     Pulses: Normal pulses.  Heart sounds: Normal heart sounds. No murmur heard.    No friction rub. No gallop.  Pulmonary:     Effort: Pulmonary effort is normal. No respiratory distress.     Breath sounds: Normal breath sounds.  Abdominal:     General: Abdomen is flat. Bowel sounds are normal. There is no distension.     Palpations: Abdomen is soft.     Tenderness: There is no abdominal tenderness.  Skin:    General: Skin is warm and dry.     Coloration: Skin is not jaundiced.  Neurological:     Mental Status: She is alert. Mental status is at baseline.     Coordination: Coordination normal.     ED Results / Procedures / Treatments   Labs (all labs ordered are listed, but only abnormal results are displayed) Labs Reviewed  CBC WITH DIFFERENTIAL/PLATELET - Abnormal; Notable for  the following components:      Result Value   RBC 5.69 (*)    Hemoglobin 9.5 (*)    HCT 34.3 (*)    MCV 60.3 (*)    MCH 16.7 (*)    MCHC 27.7 (*)    RDW 23.2 (*)    Platelets 500 (*)    All other components within normal limits  PREGNANCY, URINE    EKG None  Radiology No results found.  Procedures Procedures    Medications Ordered in ED Medications - No data to display  ED Course/ Medical Decision Making/ A&P                           Medical Decision Making Amount and/or Complexity of Data Reviewed Labs: ordered.   Patient presents due to vaginal bleeding.  Differential includes but not limited to rupture ectopic pregnancy, hemorrhagic shock, symptomatic anemia.  Patient's vitals are reassuring, blood pressure is stable at 127/72, not tachycardic.  No abdominal tenderness or rigidity, pelvic exam deferred.  I reviewed external records, patient has a history of significant vaginal bleeding and has required transfusion previously.  I ordered and viewed laboratory work-up.  Primary interpretation patient is not pregnant, not a ruptured ectopic.  Patient is anemic at 9.5 hemoglobin, this actually improved compared to her baseline per chart review.  There are nonspecific burr, target cells and cytology.  Patient does not need transfusion.  Consider pelvic but engaged in shared decision-making and do not feel it is emergently indicated.    Suspect patient has symptomatic anemia secondary to vaginal bleeding.  I do not see any signs of acute hemorrhage or emergent etiology for the bleed, will have patient follow-up with gynecologist.  Discharged in stable condition.        Final Clinical Impression(s) / ED Diagnoses Final diagnoses:  Vaginal bleeding  Symptomatic anemia    Rx / DC Orders ED Discharge Orders     None         Sherrill Raring, PA-C 02/07/22 [place], [place], DO 02/08/22 1525

## 2022-02-07 NOTE — ED Triage Notes (Signed)
Pt is c/o heavy vaginal bleeding  Pt states her period started on June 2nd  Pt states this is a chronic problem and sometimes she has to get a blood transfusion

## 2022-08-31 IMAGING — DX DG CHEST 1V PORT
1 series · 1 of 1 positions shown · non-contrast
Comparison: 05/23/2019

CLINICAL DATA: Shortness of breath.

EXAM:
PORTABLE CHEST 1 VIEW

[chest ap]
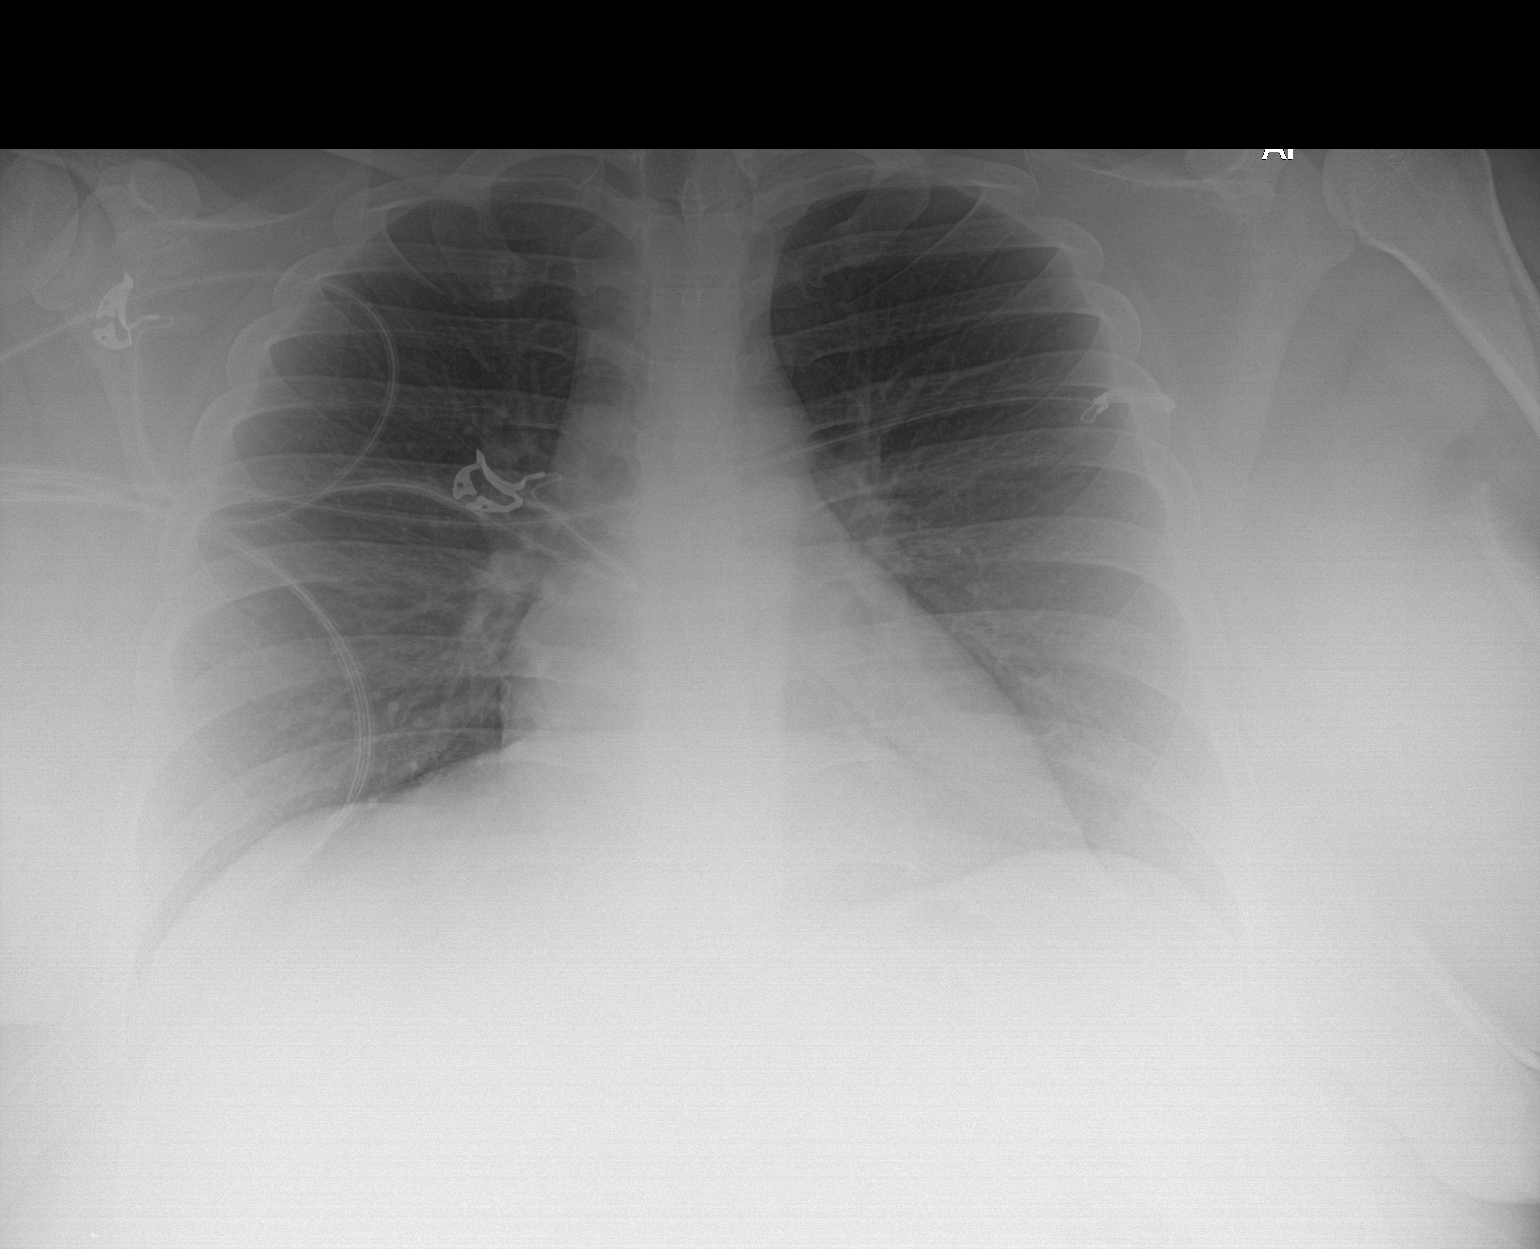

[1 of 1 positions shown; findings below may reference images not displayed]

FINDINGS: The cardiac silhouette, mediastinal and hilar contours are normal
and stable. The lungs are clear. No pleural effusions. No pulmonary
lesions. The bony thorax is intact.
IMPRESSION: No acute cardiopulmonary findings.

## 2022-10-24 ENCOUNTER — Other Ambulatory Visit: Payer: Self-pay

## 2022-10-24 ENCOUNTER — Encounter (HOSPITAL_BASED_OUTPATIENT_CLINIC_OR_DEPARTMENT_OTHER): Payer: Self-pay

## 2022-10-24 DIAGNOSIS — R42 Dizziness and giddiness: Secondary | ICD-10-CM | POA: Insufficient documentation

## 2022-10-24 DIAGNOSIS — R112 Nausea with vomiting, unspecified: Secondary | ICD-10-CM | POA: Diagnosis not present

## 2022-10-24 DIAGNOSIS — R197 Diarrhea, unspecified: Secondary | ICD-10-CM | POA: Insufficient documentation

## 2022-10-24 DIAGNOSIS — R1084 Generalized abdominal pain: Secondary | ICD-10-CM | POA: Diagnosis not present

## 2022-10-24 DIAGNOSIS — Z20822 Contact with and (suspected) exposure to covid-19: Secondary | ICD-10-CM | POA: Diagnosis not present

## 2022-10-24 DIAGNOSIS — R109 Unspecified abdominal pain: Secondary | ICD-10-CM | POA: Diagnosis present

## 2022-10-24 LAB — COMPREHENSIVE METABOLIC PANEL
ALT: 16 U/L (ref 0–44)
AST: 20 U/L (ref 15–41)
Albumin: 3.8 g/dL (ref 3.5–5.0)
Alkaline Phosphatase: 137 U/L — ABNORMAL HIGH (ref 38–126)
Anion gap: 10 (ref 5–15)
BUN: 17 mg/dL (ref 6–20)
CO2: 23 mmol/L (ref 22–32)
Calcium: 8.8 mg/dL — ABNORMAL LOW (ref 8.9–10.3)
Chloride: 103 mmol/L (ref 98–111)
Creatinine, Ser: 0.89 mg/dL (ref 0.44–1.00)
GFR, Estimated: >60 mL/min (ref >60)
Glucose, Bld: 103 mg/dL — ABNORMAL HIGH (ref 70–99)
Potassium: 3.5 mmol/L (ref 3.5–5.1)
Sodium: 136 mmol/L (ref 135–145)
Total Bilirubin: 0.5 mg/dL (ref 0.3–1.2)
Total Protein: 8.4 g/dL — ABNORMAL HIGH (ref 6.5–8.1)

## 2022-10-24 LAB — CBC
HCT: 38.4 % (ref 36.0–46.0)
Hemoglobin: 11.4 g/dL — ABNORMAL LOW (ref 12.0–15.0)
MCH: 19 pg — ABNORMAL LOW (ref 26.0–34.0)
MCHC: 29.7 g/dL — ABNORMAL LOW (ref 30.0–36.0)
MCV: 64.1 fL — ABNORMAL LOW (ref 80.0–100.0)
Platelets: 433 10*3/uL — ABNORMAL HIGH (ref 150–400)
RBC: 5.99 MIL/uL — ABNORMAL HIGH (ref 3.87–5.11)
RDW: 20.6 % — ABNORMAL HIGH (ref 11.5–15.5)
WBC: 13 10*3/uL — ABNORMAL HIGH (ref 4.0–10.5)
nRBC: 0 % (ref 0.0–0.2)

## 2022-10-24 LAB — LIPASE, BLOOD: Lipase: 31 U/L (ref 11–51)

## 2022-10-24 MED ORDER — ONDANSETRON 4 MG PO TBDP
4.0000 mg | ORAL_TABLET | Freq: Once | ORAL | Status: AC | PRN
  Administered 2022-10-24: 4 mg via ORAL
  Filled 2022-10-24: qty 1

## 2022-10-24 NOTE — ED Triage Notes (Signed)
Pt has mid ABD pain with N/V/D onset 1700 today.

## 2022-10-25 ENCOUNTER — Emergency Department (HOSPITAL_BASED_OUTPATIENT_CLINIC_OR_DEPARTMENT_OTHER)
Admission: EM | Admit: 2022-10-25 | Discharge: 2022-10-25 | Disposition: A | Discharge status: Home / Self Care | Payer: Medicaid Other | Attending: Emergency Medicine | Admitting: Emergency Medicine

## 2022-10-25 DIAGNOSIS — R1084 Generalized abdominal pain: Secondary | ICD-10-CM

## 2022-10-25 DIAGNOSIS — R112 Nausea with vomiting, unspecified: Secondary | ICD-10-CM

## 2022-10-25 LAB — URINALYSIS, MICROSCOPIC (REFLEX)

## 2022-10-25 LAB — URINALYSIS, ROUTINE W REFLEX MICROSCOPIC
Bilirubin Urine: NEGATIVE
Glucose, UA: NEGATIVE mg/dL
Hgb urine dipstick: NEGATIVE
Ketones, ur: NEGATIVE mg/dL
Nitrite: NEGATIVE
Protein, ur: NEGATIVE mg/dL
Specific Gravity, Urine: 1.02 (ref 1.005–1.030)
pH: 5.5 (ref 5.0–8.0)

## 2022-10-25 LAB — RESP PANEL BY RT-PCR (RSV, FLU A&B, COVID)  RVPGX2
Influenza A by PCR: NEGATIVE
Influenza B by PCR: NEGATIVE
Resp Syncytial Virus by PCR: NEGATIVE
SARS Coronavirus 2 by RT PCR: NEGATIVE

## 2022-10-25 LAB — PREGNANCY, URINE: Preg Test, Ur: NEGATIVE

## 2022-10-25 MED ORDER — LACTATED RINGERS IV BOLUS
1000.0000 mL | Freq: Once | INTRAVENOUS | Status: AC
  Administered 2022-10-25: 1000 mL via INTRAVENOUS

## 2022-10-25 MED ORDER — ONDANSETRON 4 MG PO TBDP: 4.0000 mg | ORAL_TABLET | Freq: Three times a day (TID) | ORAL | 0 refills | Status: AC | PRN

## 2022-10-25 NOTE — ED Provider Notes (Signed)
[facility] Provider Note   CSN: [id number] Arrival date & time: 10/24/22  2301     History  Chief Complaint  Patient presents with   Abdominal Pain    Tracey Copeland is a 32 y.o. female.  The history is provided by the patient and medical records.  Abdominal Pain Tracey Copeland is a 32 y.o. female who presents to the Emergency Department complaining of abdominal pain.  She presents to the ED for evaluation of mid abdominal pain after she ate a pizza at 6pm.  Pain is sharp in nature and feels like a hunger pain.  Has associated nausea.  She tried to sleep off the pain but woke from sleep with vomiting. Developed diarrhea shortly after the vomiting stopped.  Got dizzy after her diarrhea spell, felt lightheaded.  Had additional episode of v/d at time of ED arrival.  No dysuria, vaginal discharge.   Hx/o anemia.   Hx/o cholecystectomy.       Home Medications Prior to Admission medications   Medication Sig Start Date End Date Taking? Authorizing Provider  ondansetron (ZOFRAN-ODT) 4 MG disintegrating tablet Take 1 tablet (4 mg total) by mouth every 8 (eight) hours as needed for nausea or vomiting. 10/25/22  Yes Tilden Fossa, MD  albuterol (PROVENTIL HFA;VENTOLIN HFA) 108 (90 Base) MCG/ACT inhaler Inhale 2 puffs into the lungs every 6 (six) hours as needed for wheezing or shortness of breath.    [provider]  EPINEPHrine 0.3 mg/0.3 mL IJ SOAJ injection Inject 0.3 mg into the muscle as needed for anaphylaxis. 01/10/19   [provider]  ergocalciferol (VITAMIN D2) 1.25 MG (50000 UT) capsule Take 1 capsule (50,000 Units total) by mouth once a week. 09/23/18   Erenest Blank, NP  ferrous sulfate 325 (65 FE) MG tablet Take 1 tablet (325 mg total) by mouth 2 (two) times daily with a meal. 02/06/20   Pollina, Canary Brim, MD  hydrocortisone cream 1 % Apply to affected area 2 times daily 08/31/21   Achille Rich,  PA-C  Multiple Vitamins-Calcium (ONE-A-DAY WOMENS PO) Take 1 tablet by mouth daily.    [provider]  oxyCODONE (OXY IR/ROXICODONE) 5 MG immediate release tablet Take 1 tablet (5 mg total) by mouth every 6 (six) hours as needed for moderate pain. 12/24/20   Gaynelle Adu, MD      Allergies    Feraheme [ferumoxytol], Iron, and Onion    Review of Systems   Review of Systems  Gastrointestinal:  Positive for abdominal pain.  All other systems reviewed and are negative.   Physical Exam Updated Vital Signs BP 124/70 (BP Location: Left Arm)   Pulse 100   Temp 99.1 F (37.3 C) (Oral)   Resp 18   Ht  (1.549 m)   Wt 122 kg   LMP  (LMP Unknown)   SpO2 100%   BMI 50.83 kg/m  Physical Exam Vitals and nursing note reviewed.  Constitutional:      Appearance: She is well-developed.  HENT:     Head: Normocephalic and atraumatic.  Cardiovascular:     Rate and Rhythm: Normal rate and regular rhythm.  Pulmonary:     Effort: Pulmonary effort is normal. No respiratory distress.  Abdominal:     Palpations: Abdomen is soft.     Tenderness: There is no guarding or rebound.     Comments: Mild generalized abdominal tenderness  Musculoskeletal:        General: No swelling  or tenderness.  Skin:    General: Skin is warm and dry.  Neurological:     Mental Status: She is alert and oriented to person, place, and time.  Psychiatric:        Behavior: Behavior normal.     ED Results / Procedures / Treatments   Labs (all labs ordered are listed, but only abnormal results are displayed) Labs Reviewed  COMPREHENSIVE METABOLIC PANEL - Abnormal; Notable for the following components:      Result Value   Glucose, Bld 103 (*)    Calcium 8.8 (*)    Total Protein 8.4 (*)    Alkaline Phosphatase 137 (*)    All other components within normal limits  CBC - Abnormal; Notable for the following components:   WBC 13.0 (*)    RBC 5.99 (*)    Hemoglobin 11.4 (*)    MCV 64.1 (*)    MCH 19.0  (*)    MCHC 29.7 (*)    RDW 20.6 (*)    Platelets 433 (*)    All other components within normal limits  URINALYSIS, ROUTINE W REFLEX MICROSCOPIC - Abnormal; Notable for the following components:   APPearance CLOUDY (*)    Leukocytes,Ua TRACE (*)    All other components within normal limits  URINALYSIS, MICROSCOPIC (REFLEX) - Abnormal; Notable for the following components:   Bacteria, UA MANY (*)    All other components within normal limits  RESP PANEL BY RT-PCR (RSV, FLU A&B, COVID)  RVPGX2  LIPASE, BLOOD  PREGNANCY, URINE    EKG None  Radiology No results found.  Procedures Procedures    Medications Ordered in ED Medications  ondansetron (ZOFRAN-ODT) disintegrating tablet 4 mg (4 mg Oral Given 10/24/22 2318)  lactated ringers bolus 1,000 mL (0 mLs Intravenous Stopped 10/25/22 0413)    ED Course/ Medical Decision Making/ A&P                             Medical Decision Making Amount and/or Complexity of Data Reviewed Labs: ordered.  Risk Prescription drug management.   Patient here for evaluation of abdominal pain, vomiting and diarrhea after eating pizza.  She did have mild generalized tenderness on examination without peritoneal findings.  CBC with mild leukocytosis.  Patient was treated with IV fluids, antiemetic in the emergency department and she is able to drink fluids without difficulty.  Repeat abdominal examination is benign.  Current clinical picture is not consistent with cholecystitis, pancreatitis, appendicitis.  Discussed with patient home care for vomiting, diarrhea and abdominal pain.  Discussed outpatient follow-up as well as return precautions for progressive or new concerning symptoms.  Of note patient does have some bacteria in her urinalysis.  She does not have current urinary symptoms and there are numerous squamous epithelials.  Would not treat for UTI in the setting of the symptoms and urine findings.       Final Clinical Impression(s) / ED  Diagnoses Final diagnoses:  Nausea vomiting and diarrhea  Generalized abdominal pain    Rx / DC Orders ED Discharge Orders          Ordered    ondansetron (ZOFRAN-ODT) 4 MG disintegrating tablet  Every 8 hours PRN        10/25/22 0411              Tilden Fossa, MD 10/25/22 [phone]

## 2022-10-25 NOTE — ED Notes (Signed)
Pt given fluids for po challenge. Pt tolerated fluids well. No c/o nausea at this time.

## 2023-04-04 ENCOUNTER — Other Ambulatory Visit: Payer: Self-pay

## 2023-04-04 ENCOUNTER — Encounter: Payer: Self-pay | Admitting: Family

## 2023-04-04 ENCOUNTER — Encounter (HOSPITAL_BASED_OUTPATIENT_CLINIC_OR_DEPARTMENT_OTHER): Payer: Self-pay | Admitting: Emergency Medicine

## 2023-04-04 ENCOUNTER — Emergency Department (HOSPITAL_BASED_OUTPATIENT_CLINIC_OR_DEPARTMENT_OTHER)
Admission: EM | Admit: 2023-04-04 | Discharge: 2023-04-04 | Disposition: A | Discharge status: Home / Self Care | Payer: MEDICAID | Attending: Emergency Medicine | Admitting: Emergency Medicine

## 2023-04-04 DIAGNOSIS — Z4800 Encounter for change or removal of nonsurgical wound dressing: Secondary | ICD-10-CM | POA: Insufficient documentation

## 2023-04-04 DIAGNOSIS — Z5189 Encounter for other specified aftercare: Secondary | ICD-10-CM

## 2023-04-04 NOTE — ED Provider Notes (Signed)
[place] [facility] Provider Note   CSN: [id number] Arrival date & time: 04/04/23  1031     History  Chief Complaint  Patient presents with   Wound Check    Tracey Copeland is a 32 y.o. female.  32 year old female presents today for concern of bleeding she had from her umbilicus 2 days ago.  He states she woke up after sleeping on her belly and noticed blood on the covers.  Initially she thought it was vaginal bleeding however when she states she noticed that there was not any bleeding from her vagina.  She states she later noticed some itching within her umbilicus.  She states when this happened she cleaned it out with a baby wipe.  When she did that she noticed some dried up blood.  She came in for evaluation to ensure there was no ongoing bleeding.  Denies abdominal pain, fever, or injury.  The history is provided by the patient. No language interpreter was used.       Home Medications Prior to Admission medications   Medication Sig Start Date End Date Taking? Authorizing Provider  albuterol (PROVENTIL HFA;VENTOLIN HFA) 108 (90 Base) MCG/ACT inhaler Inhale 2 puffs into the lungs every 6 (six) hours as needed for wheezing or shortness of breath.    [provider]  EPINEPHrine 0.3 mg/0.3 mL IJ SOAJ injection Inject 0.3 mg into the muscle as needed for anaphylaxis. 01/10/19   [provider]  ergocalciferol (VITAMIN D2) 1.25 MG (50000 UT) capsule Take 1 capsule (50,000 Units total) by mouth once a week. 09/23/18   Erenest Blank, NP  ferrous sulfate 325 (65 FE) MG tablet Take 1 tablet (325 mg total) by mouth 2 (two) times daily with a meal. 02/06/20   Pollina, Canary Brim, MD  hydrocortisone cream 1 % Apply to affected area 2 times daily 08/31/21   Achille Rich, PA-C  Multiple Vitamins-Calcium (ONE-A-DAY WOMENS PO) Take 1 tablet by mouth daily.    [provider]  ondansetron (ZOFRAN-ODT) 4 MG disintegrating tablet  Take 1 tablet (4 mg total) by mouth every 8 (eight) hours as needed for nausea or vomiting. 10/25/22   Tilden Fossa, MD  oxyCODONE (OXY IR/ROXICODONE) 5 MG immediate release tablet Take 1 tablet (5 mg total) by mouth every 6 (six) hours as needed for moderate pain. 12/24/20   Gaynelle Adu, MD      Allergies    Feraheme [ferumoxytol], Iron, and Onion    Review of Systems   Review of Systems  Constitutional:  Negative for fever.  Gastrointestinal:  Negative for abdominal pain.  All other systems reviewed and are negative.   Physical Exam Updated Vital Signs BP (!) 127/55 (BP Location: Left Arm)   Pulse 74   Temp 98.2 F (36.8 C) (Oral)   Resp 16   Ht 5\' 1"  (1.549 m)   Wt 113.4 kg   SpO2 98%   BMI 47.24 kg/m  Physical Exam Vitals and nursing note reviewed.  Constitutional:      General: She is not in acute distress.    Appearance: Normal appearance. She is not ill-appearing.  HENT:     Head: Normocephalic and atraumatic.     Nose: Nose normal.  Eyes:     Conjunctiva/sclera: Conjunctivae normal.  Pulmonary:     Effort: Pulmonary effort is normal. No respiratory distress.  Abdominal:     General: There is no distension.     Palpations: Abdomen is soft.  Tenderness: There is no abdominal tenderness. There is no guarding.     Comments: Umbilicus dry.  No sign of infection.  No signs of bleeding.  Q-tip used to explore the depth of the umbilicus.  Musculoskeletal:        General: No deformity.  Skin:    Findings: No rash.  Neurological:     Mental Status: She is alert.     ED Results / Procedures / Treatments   Labs (all labs ordered are listed, but only abnormal results are displayed) Labs Reviewed - No data to display  EKG None  Radiology No results found.  Procedures Procedures    Medications Ordered in ED Medications - No data to display  ED Course/ Medical Decision Making/ A&P                                 Medical Decision  Making  32 year old female presents today for evaluation of bleeding from umbilicus that occurred 2 days ago.  No evidence of bleeding today.  Exam reassuring.  She is appropriate for discharge.  Discharged in stable condition.   Final Clinical Impression(s) / ED Diagnoses Final diagnoses:  Visit for wound check    Rx / DC Orders ED Discharge Orders     None         Marita Kansas, PA-C 04/04/23 1214    Tanda Rockers A, DO 04/10/23 [phone]

## 2023-04-04 NOTE — Discharge Instructions (Signed)
No evidence of bleeding.  Your exam is reassuring.

## 2023-04-04 NOTE — ED Triage Notes (Signed)
States her belly button  itched 2 days ago and she noticed some blood on a babywipe that she used on it today demies N/V/D or pain states not preg

## 2023-07-11 ENCOUNTER — Encounter (HOSPITAL_BASED_OUTPATIENT_CLINIC_OR_DEPARTMENT_OTHER): Payer: Self-pay

## 2023-07-11 ENCOUNTER — Emergency Department (HOSPITAL_BASED_OUTPATIENT_CLINIC_OR_DEPARTMENT_OTHER)
Admission: EM | Admit: 2023-07-11 | Discharge: 2023-07-11 | Disposition: A | Discharge status: Home / Self Care | Payer: MEDICAID | Attending: Emergency Medicine | Admitting: Emergency Medicine

## 2023-07-11 ENCOUNTER — Other Ambulatory Visit: Payer: Self-pay

## 2023-07-11 DIAGNOSIS — R739 Hyperglycemia, unspecified: Secondary | ICD-10-CM | POA: Insufficient documentation

## 2023-07-11 DIAGNOSIS — Z20822 Contact with and (suspected) exposure to covid-19: Secondary | ICD-10-CM | POA: Diagnosis not present

## 2023-07-11 DIAGNOSIS — J019 Acute sinusitis, unspecified: Secondary | ICD-10-CM | POA: Diagnosis not present

## 2023-07-11 DIAGNOSIS — R7989 Other specified abnormal findings of blood chemistry: Secondary | ICD-10-CM

## 2023-07-11 DIAGNOSIS — J329 Chronic sinusitis, unspecified: Secondary | ICD-10-CM

## 2023-07-11 DIAGNOSIS — R0981 Nasal congestion: Secondary | ICD-10-CM | POA: Diagnosis present

## 2023-07-11 LAB — CBC WITH DIFFERENTIAL/PLATELET
Abs Immature Granulocytes: 0.02 10*3/uL (ref 0.00–0.07)
Basophils Absolute: 0 10*3/uL (ref 0.0–0.1)
Basophils Relative: 0 %
Eosinophils Absolute: 0.1 10*3/uL (ref 0.0–0.5)
Eosinophils Relative: 2 %
HCT: 34.5 % — ABNORMAL LOW (ref 36.0–46.0)
Hemoglobin: 10.6 g/dL — ABNORMAL LOW (ref 12.0–15.0)
Immature Granulocytes: 0 %
Lymphocytes Relative: 39 %
Lymphs Abs: 2.9 10*3/uL (ref 0.7–4.0)
MCH: 21.8 pg — ABNORMAL LOW (ref 26.0–34.0)
MCHC: 30.7 g/dL (ref 30.0–36.0)
MCV: 70.8 fL — ABNORMAL LOW (ref 80.0–100.0)
Monocytes Absolute: 0.6 10*3/uL (ref 0.1–1.0)
Monocytes Relative: 8 %
Neutro Abs: 3.7 10*3/uL (ref 1.7–7.7)
Neutrophils Relative %: 51 %
Platelets: 363 10*3/uL (ref 150–400)
RBC: 4.87 MIL/uL (ref 3.87–5.11)
RDW: 17.2 % — ABNORMAL HIGH (ref 11.5–15.5)
WBC: 7.3 10*3/uL (ref 4.0–10.5)
nRBC: 0 % (ref 0.0–0.2)

## 2023-07-11 LAB — HCG, SERUM, QUALITATIVE: Preg, Serum: NEGATIVE

## 2023-07-11 LAB — RESP PANEL BY RT-PCR (RSV, FLU A&B, COVID)  RVPGX2
Influenza A by PCR: NEGATIVE
Influenza B by PCR: NEGATIVE
Resp Syncytial Virus by PCR: NEGATIVE
SARS Coronavirus 2 by RT PCR: NEGATIVE

## 2023-07-11 LAB — BASIC METABOLIC PANEL
Anion gap: 6 (ref 5–15)
BUN: 15 mg/dL (ref 6–20)
CO2: 23 mmol/L (ref 22–32)
Calcium: 8.4 mg/dL — ABNORMAL LOW (ref 8.9–10.3)
Chloride: 104 mmol/L (ref 98–111)
Creatinine, Ser: 1.17 mg/dL — ABNORMAL HIGH (ref 0.44–1.00)
GFR, Estimated: >60 mL/min (ref >60)
Glucose, Bld: 134 mg/dL — ABNORMAL HIGH (ref 70–99)
Potassium: 3.7 mmol/L (ref 3.5–5.1)
Sodium: 133 mmol/L — ABNORMAL LOW (ref 135–145)

## 2023-07-11 MED ORDER — AMOXICILLIN-POT CLAVULANATE 875-125 MG PO TABS: 1.0000 | ORAL_TABLET | Freq: Two times a day (BID) | ORAL | 0 refills | Status: AC

## 2023-07-11 NOTE — ED Triage Notes (Addendum)
 Pt has had congestion, fever off and on x 3 weeks. Pt states that her daughter had RSV about a mont ago and that she keeps taking meds and she can't get her symptoms to go away. Pt also states that she is still on her cycle since Aug. Thinks her hemoglobin may be low too.

## 2023-07-11 NOTE — ED Provider Notes (Signed)
 [place] [facility] Provider Note   CSN: [id number] Arrival date & time: 07/11/23  9052     History  Chief Complaint  Patient presents with   Nasal Congestion    Tracey Copeland is a 33 y.o. female who is to the emergency department with 3 weeks of nasal congestion, headaches, mild sore throat.  She has had mild cough.  She has been taking TheraFlu and other over-the-counter medications which she states helps for a little while but then stops.  She is also been complaining of significant thirst.  Is been coughing more and using her inhaler.  She denies a history of hospitalization or intubation for her asthma.  She is also been feeling a little bit of exertional dyspnea but denies any marked shortness of breath orthopnea or PND.  She has been bleeding for the past month and is waiting to get back on another form of birth control with her gynecologist.  She has been hospitalized in the past for profound anemia from vaginal bleeding.  HPI     Home Medications Prior to Admission medications   Medication Sig Start Date End Date Taking? Authorizing Provider  albuterol  (PROVENTIL  HFA;VENTOLIN  HFA) 108 (90 Base) MCG/ACT inhaler Inhale 2 puffs into the lungs every 6 (six) hours as needed for wheezing or shortness of breath.    [provider]  EPINEPHrine  0.3 mg/0.3 mL IJ SOAJ injection Inject 0.3 mg into the muscle as needed for anaphylaxis. 01/10/19   [provider]  ergocalciferol  (VITAMIN D2) 1.25 MG (50000 UT) capsule Take 1 capsule (50,000 Units total) by mouth once a week. 09/23/18   Franchot Lauraine HERO, NP  ferrous sulfate  325 (65 FE) MG tablet Take 1 tablet (325 mg total) by mouth 2 (two) times daily with a meal. 02/06/20   Pollina, Lonni PARAS, MD  hydrocortisone  cream 1 % Apply to affected area 2 times daily 08/31/21   Bernis Ernst, PA-C  Multiple Vitamins-Calcium (ONE-A-DAY WOMENS PO) Take 1 tablet by mouth daily.    [provider]  ondansetron  (ZOFRAN -ODT) 4 MG disintegrating tablet Take 1 tablet (4 mg total) by mouth every 8 (eight) hours as needed for nausea or vomiting. 10/25/22   Griselda Norris, MD  oxyCODONE  (OXY IR/ROXICODONE ) 5 MG immediate release tablet Take 1 tablet (5 mg total) by mouth every 6 (six) hours as needed for moderate pain. 12/24/20   Tanda Locus, MD      Allergies    Feraheme  [ferumoxytol ], Iron , and Onion    Review of Systems   Review of Systems  Physical Exam Updated Vital Signs BP 122/71 (BP Location: Right Arm)   Pulse 83   Temp (!) 97.3 F (36.3 C) (Oral)   Resp 18   Ht 5' 1 (1.549 m)   Wt 129.3 kg   LMP 02/07/2023 (Approximate)   SpO2 100%   BMI 53.85 kg/m  Physical Exam Vitals and nursing note reviewed.  Constitutional:      General: She is not in acute distress.    Appearance: She is well-developed. She is not diaphoretic.  HENT:     Head: Normocephalic and atraumatic.     Right Ear: External ear normal.     Left Ear: External ear normal.     Nose: Nose normal.     Mouth/Throat:     Mouth: Mucous membranes are moist.  Eyes:     General: No scleral icterus.    Conjunctiva/sclera: Conjunctivae normal.  Cardiovascular:  Rate and Rhythm: Normal rate and regular rhythm.     Heart sounds: Normal heart sounds. No murmur heard.    No friction rub. No gallop.  Pulmonary:     Effort: Pulmonary effort is normal. No respiratory distress.     Breath sounds: Normal breath sounds.  Abdominal:     General: Bowel sounds are normal. There is no distension.     Palpations: Abdomen is soft. There is no mass.     Tenderness: There is no abdominal tenderness. There is no guarding.  Musculoskeletal:     Cervical back: Normal range of motion.  Skin:    General: Skin is warm and dry.  Neurological:     Mental Status: She is alert and oriented to person, place, and time.  Psychiatric:        Behavior: Behavior normal.     ED Results / Procedures / Treatments    Labs (all labs ordered are listed, but only abnormal results are displayed) Labs Reviewed  CBC WITH DIFFERENTIAL/PLATELET - Abnormal; Notable for the following components:      Result Value   Hemoglobin 10.6 (*)    HCT 34.5 (*)    MCV 70.8 (*)    MCH 21.8 (*)    RDW 17.2 (*)    All other components within normal limits  RESP PANEL BY RT-PCR (RSV, FLU A&B, COVID)  RVPGX2  BASIC METABOLIC PANEL  HCG, SERUM, QUALITATIVE    EKG None  Radiology No results found.  Procedures Procedures    Medications Ordered in ED Medications - No data to display  ED Course/ Medical Decision Making/ A&P Clinical Course as of 07/11/23 1217  Thu Jul 11, 2023  1211 BUN: 15 [AH]  1211 Glucose(!): 134 [AH]  1211 Creatinine(!): 1.17 Patient with slightly elevated blood glucose, creatinine noted to be 1.17 which is slightly above her baseline of 0.9.  Certainly this could represent prediabetes or diabetes.  Will recommend the patient to see her PCP very closely and follow-up for repeat lab testing. The remainder of the patient's labs are unremarkable.  She has negative respiratory panel testing.  Given the length of her symptoms we will treat the patient for presumed cysts bacterial sinusitis with Augmentin .  [AH]    Clinical Course User Index [AH] Arloa Chroman, PA-C                                 Medical Decision Making As per ED  Amount and/or Complexity of Data Reviewed Labs: ordered. Decision-making details documented in ED Course.           Final Clinical Impression(s) / ED Diagnoses Final diagnoses:  None    Rx / DC Orders ED Discharge Orders     None         Arloa Chroman, PA-C 07/11/23 1217    [name], MD 07/18/23 1042

## 2023-07-11 NOTE — Discharge Instructions (Signed)
 Your lab work today showed elevated blood sugar at 134.  Given your recent symptoms of excessive thirst this could represent prediabetes or diabetes.  This is a diagnosis that needs to be worked up by a primary care physician.  If you do not have a primary care physician you may use the 800-number in this paperwork to be set up with a primary care doctor. Secondly your creatinine which is a measure of your kidney function was slightly above normal as well.  It is important that you increase your fluid intake.  Should be drinking at least 64 ounces of water a day. You should have your creatinine rechecked at a primary care office or urgent care.  The remainder of your lab work was unremarkable except for some mild anemia which appears to be chronic. Will up with your gynecologist about your vaginal bleeding.  Going to treat you with an antibiotic since you have had more than 3 weeks of facial pressure and sinus pain. Get help right away if: You have a severe headache. You have persistent vomiting. You have severe pain or swelling around your face or eyes. You have vision problems. You develop confusion. Your neck is stiff. You have trouble breathing. These symptoms may be an emergency. Get help right away. Call 911. Do not wait to see if the symptoms will go away. Do not drive yourself to the hospital.

## 2023-08-30 ENCOUNTER — Encounter: Payer: Self-pay | Admitting: Family

## 2023-08-30 ENCOUNTER — Other Ambulatory Visit (HOSPITAL_BASED_OUTPATIENT_CLINIC_OR_DEPARTMENT_OTHER): Payer: Self-pay

## 2023-08-30 ENCOUNTER — Encounter (HOSPITAL_BASED_OUTPATIENT_CLINIC_OR_DEPARTMENT_OTHER): Payer: Self-pay | Admitting: Emergency Medicine

## 2023-08-30 ENCOUNTER — Other Ambulatory Visit: Payer: Self-pay

## 2023-08-30 ENCOUNTER — Emergency Department (HOSPITAL_BASED_OUTPATIENT_CLINIC_OR_DEPARTMENT_OTHER)
Admission: EM | Admit: 2023-08-30 | Discharge: 2023-08-30 | Disposition: A | Discharge status: Home / Self Care | Payer: MEDICAID | Attending: Emergency Medicine | Admitting: Emergency Medicine

## 2023-08-30 DIAGNOSIS — E871 Hypo-osmolality and hyponatremia: Secondary | ICD-10-CM | POA: Insufficient documentation

## 2023-08-30 DIAGNOSIS — Z79899 Other long term (current) drug therapy: Secondary | ICD-10-CM | POA: Insufficient documentation

## 2023-08-30 DIAGNOSIS — E876 Hypokalemia: Secondary | ICD-10-CM | POA: Insufficient documentation

## 2023-08-30 DIAGNOSIS — N939 Abnormal uterine and vaginal bleeding, unspecified: Secondary | ICD-10-CM | POA: Diagnosis not present

## 2023-08-30 DIAGNOSIS — R5383 Other fatigue: Secondary | ICD-10-CM | POA: Diagnosis present

## 2023-08-30 HISTORY — DX: Dysmenorrhea, unspecified: N94.6

## 2023-08-30 LAB — COMPREHENSIVE METABOLIC PANEL
ALT: 15 U/L (ref 0–44)
AST: 14 U/L — ABNORMAL LOW (ref 15–41)
Albumin: 3.4 g/dL — ABNORMAL LOW (ref 3.5–5.0)
Alkaline Phosphatase: 116 U/L (ref 38–126)
Anion gap: 9 (ref 5–15)
BUN: 12 mg/dL (ref 6–20)
CO2: 24 mmol/L (ref 22–32)
Calcium: 8.5 mg/dL — ABNORMAL LOW (ref 8.9–10.3)
Chloride: 101 mmol/L (ref 98–111)
Creatinine, Ser: 0.86 mg/dL (ref 0.44–1.00)
GFR, Estimated: >60 mL/min (ref >60)
Glucose, Bld: 117 mg/dL — ABNORMAL HIGH (ref 70–99)
Potassium: 3.2 mmol/L — ABNORMAL LOW (ref 3.5–5.1)
Sodium: 134 mmol/L — ABNORMAL LOW (ref 135–145)
Total Bilirubin: 0.5 mg/dL (ref 0.0–1.2)
Total Protein: 7.5 g/dL (ref 6.5–8.1)

## 2023-08-30 LAB — CBC WITH DIFFERENTIAL/PLATELET
Abs Immature Granulocytes: 0.04 10*3/uL (ref 0.00–0.07)
Basophils Absolute: 0 10*3/uL (ref 0.0–0.1)
Basophils Relative: 0 %
Eosinophils Absolute: 0.1 10*3/uL (ref 0.0–0.5)
Eosinophils Relative: 1 %
HCT: 31.8 % — ABNORMAL LOW (ref 36.0–46.0)
Hemoglobin: 9.7 g/dL — ABNORMAL LOW (ref 12.0–15.0)
Immature Granulocytes: 0 %
Lymphocytes Relative: 36 %
Lymphs Abs: 3.5 10*3/uL (ref 0.7–4.0)
MCH: 21.4 pg — ABNORMAL LOW (ref 26.0–34.0)
MCHC: 30.5 g/dL (ref 30.0–36.0)
MCV: 70 fL — ABNORMAL LOW (ref 80.0–100.0)
Monocytes Absolute: 0.5 10*3/uL (ref 0.1–1.0)
Monocytes Relative: 5 %
Neutro Abs: 5.6 10*3/uL (ref 1.7–7.7)
Neutrophils Relative %: 58 %
Platelets: 405 10*3/uL — ABNORMAL HIGH (ref 150–400)
RBC: 4.54 MIL/uL (ref 3.87–5.11)
RDW: 16.3 % — ABNORMAL HIGH (ref 11.5–15.5)
WBC: 9.8 10*3/uL (ref 4.0–10.5)
nRBC: 0 % (ref 0.0–0.2)

## 2023-08-30 LAB — URINALYSIS, ROUTINE W REFLEX MICROSCOPIC
Bilirubin Urine: NEGATIVE
Glucose, UA: NEGATIVE mg/dL
Ketones, ur: NEGATIVE mg/dL
Leukocytes,Ua: NEGATIVE
Nitrite: NEGATIVE
Protein, ur: 30 mg/dL — AB
Specific Gravity, Urine: 1.025 (ref 1.005–1.030)
pH: 6 (ref 5.0–8.0)

## 2023-08-30 LAB — URINALYSIS, MICROSCOPIC (REFLEX): RBC / HPF: >50 RBC/hpf (ref 0–5)

## 2023-08-30 LAB — HCG, QUANTITATIVE, PREGNANCY: hCG, Beta Chain, Quant, S: <1 m[IU]/mL (ref <5)

## 2023-08-30 MED ORDER — IBUPROFEN 400 MG PO TABS
600.0000 mg | ORAL_TABLET | Freq: Once | ORAL | Status: AC
  Administered 2023-08-30: 600 mg via ORAL
  Filled 2023-08-30: qty 1

## 2023-08-30 MED ORDER — MEDROXYPROGESTERONE ACETATE 5 MG PO TABS
20.0000 mg | ORAL_TABLET | Freq: Three times a day (TID) | ORAL | 0 refills | Status: DC
  Filled 2023-08-30: qty 84, 7d supply, fill #0

## 2023-08-30 MED ORDER — POTASSIUM CHLORIDE 20 MEQ PO PACK
40.0000 meq | PACK | Freq: Once | ORAL | Status: AC
  Administered 2023-08-30: 40 meq via ORAL
  Filled 2023-08-30: qty 2

## 2023-08-30 MED ORDER — MEDROXYPROGESTERONE ACETATE 10 MG PO TABS
10.0000 mg | ORAL_TABLET | Freq: Once | ORAL | Status: AC
  Administered 2023-08-30: 10 mg via ORAL
  Filled 2023-08-30: qty 1

## 2023-08-30 NOTE — Discharge Instructions (Addendum)
 Your hemoglobin was low here today at 9.7, but this is not a significant drop from your baseline hemoglobin (which seems to be around 10.6).  You do not need a blood transfusion today.  However, if you continue to have significant dizziness, fatigue, shortness of breath, or any other new or concerning symptoms, please return to the ER to have this value rechecked.  You have been prescribed a medication called medroxyprogesterone (provera) to help with your heavy vaginal bleeding. Take 20mg  by mouth three times a day for the next 7 days. You were given your first dose here today.  You may use up to 600mg  ibuprofen every 6 hours as needed for pain and to help with your vaginal bleeding.  Do not exceed 2.4g of ibuprofen per day.  You are given your first dose here today at 12:45 PM, your next dose can be no sooner than 6:45 PM tonight.  Your sodium, calcium, and potassium values were slightly low here today. Please increase dietary intake of calcium with foods such as diary products. Please increase dietary intake of potassium with foods such as bananas and avocados. You were also given a potassium supplement here today.  Please follow-up with your gynecologist as soon as possible for further management of your heavy menstrual bleeding.

## 2023-08-30 NOTE — ED Triage Notes (Signed)
 Dysmenorrhea since she was a teenager.  Pt states her cycles have been very heavy, has had to have blood transfusions in the past.  Pt does have OB/GYN which she has followed and pt requested a hysterectomy.  Pt is frustrated because she doesn't know why she is having heavy periods that last over a week.  Pt is currently on her cycle since December 28.  Pt has taken po birth control.

## 2023-08-30 NOTE — ED Provider Notes (Signed)
 [place] [facility] Provider Note   CSN: [id number] Arrival date & time: 08/30/23  1044     History  Chief Complaint  Patient presents with   Dysmenorrhea    Tracey Copeland is a 33 y.o. female with history of anemia requiring blood transfusions, dysmenorrhea, presents with concern for heavy vaginal bleeding that has been ongoing for about 2 months now.  She states she frequently has heavy and prolonged cycles and has had to have blood transfusions for low hemoglobin values.  She reports she felt very fatigued yesterday and slept most of the day.  Denies any shortness of breath.  She reports taking a birth control pill, unsure which medication.  HPI     Home Medications Prior to Admission medications   Medication Sig Start Date End Date Taking? Authorizing Provider  medroxyPROGESTERone (PROVERA) 5 MG tablet Take 4 tablets (20 mg total) by mouth 3 (three) times daily for 7 days. 08/30/23 09/06/23 Yes Arabella Merles, PA-C  albuterol (PROVENTIL HFA;VENTOLIN HFA) 108 (90 Base) MCG/ACT inhaler Inhale 2 puffs into the lungs every 6 (six) hours as needed for wheezing or shortness of breath.    [provider]  amoxicillin-clavulanate (AUGMENTIN) 875-125 MG tablet Take 1 tablet by mouth every 12 (twelve) hours. 07/11/23   Arthor Captain, PA-C  EPINEPHrine 0.3 mg/0.3 mL IJ SOAJ injection Inject 0.3 mg into the muscle as needed for anaphylaxis. 01/10/19   [provider]  ergocalciferol (VITAMIN D2) 1.25 MG (50000 UT) capsule Take 1 capsule (50,000 Units total) by mouth once a week. 09/23/18   Erenest Blank, NP  ferrous sulfate 325 (65 FE) MG tablet Take 1 tablet (325 mg total) by mouth 2 (two) times daily with a meal. 02/06/20   Pollina, Canary Brim, MD  hydrocortisone cream 1 % Apply to affected area 2 times daily 08/31/21   Achille Rich, PA-C  Multiple Vitamins-Calcium (ONE-A-DAY WOMENS PO) Take 1 tablet by mouth daily.    [provider]  ondansetron (ZOFRAN-ODT) 4 MG disintegrating tablet Take 1 tablet (4 mg total) by mouth every 8 (eight) hours as needed for nausea or vomiting. 10/25/22   Tilden Fossa, MD  oxyCODONE (OXY IR/ROXICODONE) 5 MG immediate release tablet Take 1 tablet (5 mg total) by mouth every 6 (six) hours as needed for moderate pain. 12/24/20   Gaynelle Adu, MD      Allergies    Feraheme [ferumoxytol], Iron, and Onion    Review of Systems   Review of Systems  Gastrointestinal:  Negative for abdominal pain.  Genitourinary:  Positive for menstrual problem.    Physical Exam Updated Vital Signs BP 126/66 (BP Location: Right Arm)   Pulse 81   Temp 98.3 F (36.8 C) (Oral)   Resp 20   Ht 5\' 1"  (1.549 m)   Wt (!) 168 kg   LMP 07/06/2023 Comment: pt is still on her period, has not stopped bleeding  SpO2 100%   BMI 69.98 kg/m  Physical Exam Vitals and nursing note reviewed.  Constitutional:      General: She is not in acute distress.    Appearance: She is well-developed.  HENT:     Head: Normocephalic and atraumatic.  Eyes:     Conjunctiva/sclera: Conjunctivae normal.  Cardiovascular:     Rate and Rhythm: Normal rate and regular rhythm.     Heart sounds: No murmur heard. Pulmonary:     Effort: Pulmonary effort is normal. No respiratory distress.  Breath sounds: Normal breath sounds.  Abdominal:     Palpations: Abdomen is soft.     Tenderness: There is no abdominal tenderness.  Genitourinary:    Comments: Patient declines GU exam Musculoskeletal:        General: No swelling.     Cervical back: Neck supple.  Skin:    General: Skin is warm and dry.     Capillary Refill: Capillary refill takes less than 2 seconds.  Neurological:     Mental Status: She is alert.  Psychiatric:        Mood and Affect: Mood normal.     ED Results / Procedures / Treatments   Labs (all labs ordered are listed, but only abnormal results are displayed) Labs Reviewed  CBC WITH  DIFFERENTIAL/PLATELET - Abnormal; Notable for the following components:      Result Value   Hemoglobin 9.7 (*)    HCT 31.8 (*)    MCV 70.0 (*)    MCH 21.4 (*)    RDW 16.3 (*)    Platelets 405 (*)    All other components within normal limits  COMPREHENSIVE METABOLIC PANEL - Abnormal; Notable for the following components:   Sodium 134 (*)    Potassium 3.2 (*)    Glucose, Bld 117 (*)    Calcium 8.5 (*)    Albumin 3.4 (*)    AST 14 (*)    All other components within normal limits  URINALYSIS, ROUTINE W REFLEX MICROSCOPIC - Abnormal; Notable for the following components:   Hgb urine dipstick LARGE (*)    Protein, ur 30 (*)    All other components within normal limits  URINALYSIS, MICROSCOPIC (REFLEX) - Abnormal; Notable for the following components:   Bacteria, UA FEW (*)    All other components within normal limits  HCG, QUANTITATIVE, PREGNANCY    EKG None  Radiology No results found.  Procedures Procedures    Medications Ordered in ED Medications  medroxyPROGESTERone (PROVERA) tablet 10 mg (10 mg Oral Given 08/30/23 1246)  ibuprofen (ADVIL) tablet 600 mg (600 mg Oral Given 08/30/23 1245)  potassium chloride (KLOR-CON) packet 40 mEq (40 mEq Oral Given 08/30/23 1301)    ED Course/ Medical Decision Making/ A&P                                 Medical Decision Making Amount and/or Complexity of Data Reviewed Labs: ordered.  Risk Prescription drug management.     Differential diagnosis includes but is not limited to blood loss anemia, abnormal uterine bleeding, pregnancy  ED Course:  Patient overall well-appearing, stable vital signs.  Abdomen soft and nontender.  Patient declines GU exam. I Ordered, and personally interpreted labs.  The pertinent results include:   CBC with hemoglobin 9.7, this appears to be at patient baseline as previous labs show hemoglobin of 10.6 last month CMP with hyponatremia at 134, hypokalemia 3.2, hypocalcemia 8.5.  LFTs within normal  limits, creatinine within normal limits Pregnancy negative Urinalysis without nitrites or leukocytes Suspect her anemia today is due to her heavy vaginal bleeding.  However, hemoglobin level is not below 7 with stable vitals, no indication for transfusion at this time.  Patient given Provera tablet and ibuprofen to help with her menstrual bleeding and pain.  Was given 40 mEq of KCl orally for her hypokalemia.  We will try continuing Provera at home to see if this helps with her bleeding.  Encouraged  her to make an appointment with her gynecologist as soon as possible for further management of her heavy menstrual bleeding.  Patient stable and appropriate for discharge home this time  Impression: Heavy menstrual bleeding Hypokalemia Hyponatremia Hypocalcemia  Disposition:  The patient was discharged home with instructions to take Provera as prescribed.  May take ibuprofen as needed for pain and to help with bleeding.  Follow-up with gynecologist to soon as possible for further management. Increase dietary intake of calcium and potassium and follow up with PCP to have these values rechecked  Return precautions given.            Final Clinical Impression(s) / ED Diagnoses Final diagnoses:  Abnormal uterine bleeding  Hypocalcemia  Hypokalemia  Hyponatremia    Rx / DC Orders ED Discharge Orders          Ordered    medroxyPROGESTERone (PROVERA) 5 MG tablet  3 times daily        08/30/23 1256              Arabella Merles, [place] 08/30/23 1310    Cathren Laine, MD 09/01/23 [phone]

## 2023-09-22 ENCOUNTER — Encounter (HOSPITAL_BASED_OUTPATIENT_CLINIC_OR_DEPARTMENT_OTHER): Payer: Self-pay | Admitting: Emergency Medicine

## 2023-09-22 ENCOUNTER — Emergency Department (HOSPITAL_BASED_OUTPATIENT_CLINIC_OR_DEPARTMENT_OTHER)
Admission: EM | Admit: 2023-09-22 | Discharge: 2023-09-22 | Disposition: A | Discharge status: Home / Self Care | Payer: MEDICAID | Attending: Emergency Medicine | Admitting: Emergency Medicine

## 2023-09-22 ENCOUNTER — Other Ambulatory Visit: Payer: Self-pay

## 2023-09-22 DIAGNOSIS — R5383 Other fatigue: Secondary | ICD-10-CM | POA: Insufficient documentation

## 2023-09-22 DIAGNOSIS — N938 Other specified abnormal uterine and vaginal bleeding: Secondary | ICD-10-CM

## 2023-09-22 DIAGNOSIS — E876 Hypokalemia: Secondary | ICD-10-CM | POA: Insufficient documentation

## 2023-09-22 DIAGNOSIS — N939 Abnormal uterine and vaginal bleeding, unspecified: Secondary | ICD-10-CM | POA: Insufficient documentation

## 2023-09-22 LAB — COMPREHENSIVE METABOLIC PANEL
ALT: 14 U/L (ref 0–44)
AST: 14 U/L — ABNORMAL LOW (ref 15–41)
Albumin: 3.2 g/dL — ABNORMAL LOW (ref 3.5–5.0)
Alkaline Phosphatase: 108 U/L (ref 38–126)
Anion gap: 6 (ref 5–15)
BUN: 10 mg/dL (ref 6–20)
CO2: 27 mmol/L (ref 22–32)
Calcium: 8.4 mg/dL — ABNORMAL LOW (ref 8.9–10.3)
Chloride: 104 mmol/L (ref 98–111)
Creatinine, Ser: 0.86 mg/dL (ref 0.44–1.00)
GFR, Estimated: >60 mL/min (ref >60)
Glucose, Bld: 176 mg/dL — ABNORMAL HIGH (ref 70–99)
Potassium: 3.1 mmol/L — ABNORMAL LOW (ref 3.5–5.1)
Sodium: 137 mmol/L (ref 135–145)
Total Bilirubin: 0.6 mg/dL (ref 0.0–1.2)
Total Protein: 7.2 g/dL (ref 6.5–8.1)

## 2023-09-22 LAB — CBC WITH DIFFERENTIAL/PLATELET
Abs Immature Granulocytes: 0.03 10*3/uL (ref 0.00–0.07)
Basophils Absolute: 0 10*3/uL (ref 0.0–0.1)
Basophils Relative: 0 %
Eosinophils Absolute: 0 10*3/uL (ref 0.0–0.5)
Eosinophils Relative: 1 %
HCT: 26.2 % — ABNORMAL LOW (ref 36.0–46.0)
Hemoglobin: 7.8 g/dL — ABNORMAL LOW (ref 12.0–15.0)
Immature Granulocytes: 0 %
Lymphocytes Relative: 31 %
Lymphs Abs: 2.4 10*3/uL (ref 0.7–4.0)
MCH: 19.9 pg — ABNORMAL LOW (ref 26.0–34.0)
MCHC: 29.8 g/dL — ABNORMAL LOW (ref 30.0–36.0)
MCV: 67 fL — ABNORMAL LOW (ref 80.0–100.0)
Monocytes Absolute: 0.3 10*3/uL (ref 0.1–1.0)
Monocytes Relative: 4 %
Neutro Abs: 4.9 10*3/uL (ref 1.7–7.7)
Neutrophils Relative %: 64 %
Platelets: 437 10*3/uL — ABNORMAL HIGH (ref 150–400)
RBC: 3.91 MIL/uL (ref 3.87–5.11)
RDW: 16.8 % — ABNORMAL HIGH (ref 11.5–15.5)
WBC: 7.6 10*3/uL (ref 4.0–10.5)
nRBC: 0 % (ref 0.0–0.2)

## 2023-09-22 LAB — HCG, QUANTITATIVE, PREGNANCY: hCG, Beta Chain, Quant, S: <1 m[IU]/mL (ref <5)

## 2023-09-22 MED ORDER — MEGESTROL ACETATE 40 MG PO TABS: ORAL_TABLET | ORAL | 0 refills | Status: DC

## 2023-09-22 MED ORDER — MEGESTROL ACETATE 40 MG PO TABS
40.0000 mg | ORAL_TABLET | ORAL | Status: AC
  Administered 2023-09-22: 40 mg via ORAL
  Filled 2023-09-22: qty 1

## 2023-09-22 MED ORDER — POTASSIUM CHLORIDE CRYS ER 20 MEQ PO TBCR
40.0000 meq | EXTENDED_RELEASE_TABLET | Freq: Once | ORAL | Status: AC
  Administered 2023-09-22: 40 meq via ORAL
  Filled 2023-09-22: qty 2

## 2023-09-22 NOTE — ED Provider Notes (Signed)
 [place] [facility] Provider Note   CSN: [id number] Arrival date & time: 09/22/23  1911     History {Add pertinent medical, surgical, social history, OB history to HPI:1} Chief Complaint  Patient presents with   Vaginal Bleeding    Tracey Copeland is a 33 y.o. female.   Vaginal Bleeding      Home Medications Prior to Admission medications   Medication Sig Start Date End Date Taking? Authorizing Provider  albuterol (PROVENTIL HFA;VENTOLIN HFA) 108 (90 Base) MCG/ACT inhaler Inhale 2 puffs into the lungs every 6 (six) hours as needed for wheezing or shortness of breath.    [provider]  amoxicillin-clavulanate (AUGMENTIN) 875-125 MG tablet Take 1 tablet by mouth every 12 (twelve) hours. 07/11/23   Arthor Captain, PA-C  EPINEPHrine 0.3 mg/0.3 mL IJ SOAJ injection Inject 0.3 mg into the muscle as needed for anaphylaxis. 01/10/19   [provider]  ergocalciferol (VITAMIN D2) 1.25 MG (50000 UT) capsule Take 1 capsule (50,000 Units total) by mouth once a week. 09/23/18   Erenest Blank, NP  ferrous sulfate 325 (65 FE) MG tablet Take 1 tablet (325 mg total) by mouth 2 (two) times daily with a meal. 02/06/20   Pollina, Canary Brim, MD  hydrocortisone cream 1 % Apply to affected area 2 times daily 08/31/21   Achille Rich, PA-C  medroxyPROGESTERone (PROVERA) 5 MG tablet Take 4 tablets (20 mg total) by mouth 3 (three) times daily for 7 days. 08/30/23 09/06/23  Arabella Merles, PA-C  Multiple Vitamins-Calcium (ONE-A-DAY WOMENS PO) Take 1 tablet by mouth daily.    [provider]  ondansetron (ZOFRAN-ODT) 4 MG disintegrating tablet Take 1 tablet (4 mg total) by mouth every 8 (eight) hours as needed for nausea or vomiting. 10/25/22   Tilden Fossa, MD  oxyCODONE (OXY IR/ROXICODONE) 5 MG immediate release tablet Take 1 tablet (5 mg total) by mouth every 6 (six) hours as needed for moderate pain. 12/24/20   Gaynelle Adu, MD       Allergies    Feraheme [ferumoxytol], Iron, and Onion    Review of Systems   Review of Systems  Genitourinary:  Positive for vaginal bleeding.    Physical Exam Updated Vital Signs BP 136/80 (BP Location: Left Wrist)   Pulse 99   Temp 98.2 F (36.8 C)   Resp (!) 24   Ht 5\' 1"  (1.549 m)   Wt (!) 168 kg   LMP 07/06/2023 Comment: pt is still on her period, has not stopped bleeding  SpO2 100%   BMI 69.98 kg/m  Physical Exam  ED Results / Procedures / Treatments   Labs (all labs ordered are listed, but only abnormal results are displayed) Labs Reviewed  CBC WITH DIFFERENTIAL/PLATELET - Abnormal; Notable for the following components:      Result Value   Hemoglobin 7.8 (*)    HCT 26.2 (*)    MCV 67.0 (*)    MCH 19.9 (*)    MCHC 29.8 (*)    RDW 16.8 (*)    Platelets 437 (*)    All other components within normal limits  COMPREHENSIVE METABOLIC PANEL - Abnormal; Notable for the following components:   Potassium 3.1 (*)    Glucose, Bld 176 (*)    Calcium 8.4 (*)    Albumin 3.2 (*)    AST 14 (*)    All other components within normal limits  HCG, QUANTITATIVE, PREGNANCY  URINALYSIS, ROUTINE W REFLEX MICROSCOPIC  EKG None  Radiology No results found.  Procedures Procedures  {Document cardiac monitor, telemetry assessment procedure when appropriate:1}  Medications Ordered in ED Medications - No data to display  ED Course/ Medical Decision Making/ A&P   {   Click here for ABCD2, HEART and other calculatorsREFRESH Note before signing :1}                              Medical Decision Making Amount and/or Complexity of Data Reviewed Labs: ordered.   ***  {Document critical care time when appropriate:1} {Document review of labs and clinical decision tools ie heart score, Chads2Vasc2 etc:1}  {Document your independent review of radiology images, and any outside records:1} {Document your discussion with family members, caretakers, and with  consultants:1} {Document social determinants of health affecting pt's care:1} {Document your decision making why or why not admission, treatments were needed:1} Final Clinical Impression(s) / ED Diagnoses Final diagnoses:  None    Rx / DC Orders ED Discharge Orders     None

## 2023-09-22 NOTE — ED Triage Notes (Signed)
 Heavy vaginal bleeding since December. Ongoing issue. Seen for same on 08/30/2023 and prescribed Provera. Patient states medicine made her bleeding worse. Also endorses abdominal cramping.

## 2023-09-22 NOTE — Discharge Instructions (Addendum)
 You were seen in the ER today for evaluation of your symptoms. Your hemoglobin is lower. Gynecology would like for you to take the megace as prescribed to help you with your bleeding. You will need to follow up with your PCP or GYN for follow up and recheck on your hemoglobin and potassium. Please be diligent with taking your medication and do not miss any doses. If you start to have any new or worsening symptoms, please return to the ER.   Contact a doctor if: The bleeding lasts more than one week. You feel dizzy at times. You feel like you may vomit (nausea). You vomit. You feel light-headed or weak. Your symptoms get worse. Get help right away if: You faint. You have to change pads every hour. You have pain in your belly. You have a fever or chills. You get sweaty or weak. You pass large blood clots from your vagina. These symptoms may be an emergency. Get help right away. Call your local emergency services (911 in the [place] Do not wait to see if the symptoms will go away. Do not drive yourself to the hospital.

## 2023-09-23 ENCOUNTER — Telehealth (HOSPITAL_BASED_OUTPATIENT_CLINIC_OR_DEPARTMENT_OTHER): Payer: Self-pay | Admitting: Emergency Medicine

## 2023-09-23 DIAGNOSIS — N939 Abnormal uterine and vaginal bleeding, unspecified: Secondary | ICD-10-CM

## 2023-09-23 MED ORDER — MEGESTROL ACETATE 40 MG PO TABS
40.0000 mg | ORAL_TABLET | Freq: Every day | ORAL | 0 refills | Status: AC
Start: 2023-09-23 — End: ?

## 2023-09-23 NOTE — ED Notes (Signed)
 Patient called in and stated Pharmacy stated they did not have the Prescription from visit on 09/22/2022.  PA will review and handle.

## 2023-09-23 NOTE — Telephone Encounter (Cosign Needed)
 Patient with her pharmacy today and stated that Megace had not been sent.  I did see an order sent to her pharmacy but nevertheless discontinue that order and sent it again.

## 2024-04-10 ENCOUNTER — Other Ambulatory Visit: Payer: Self-pay

## 2024-04-10 ENCOUNTER — Emergency Department (HOSPITAL_BASED_OUTPATIENT_CLINIC_OR_DEPARTMENT_OTHER): Payer: MEDICAID

## 2024-04-10 ENCOUNTER — Emergency Department (HOSPITAL_BASED_OUTPATIENT_CLINIC_OR_DEPARTMENT_OTHER)
Admission: EM | Admit: 2024-04-10 | Discharge: 2024-04-10 | Disposition: A | Discharge status: Home / Self Care | Payer: MEDICAID | Attending: Emergency Medicine | Admitting: Emergency Medicine

## 2024-04-10 ENCOUNTER — Encounter (HOSPITAL_BASED_OUTPATIENT_CLINIC_OR_DEPARTMENT_OTHER): Payer: Self-pay | Admitting: Emergency Medicine

## 2024-04-10 DIAGNOSIS — J069 Acute upper respiratory infection, unspecified: Secondary | ICD-10-CM

## 2024-04-10 DIAGNOSIS — R0602 Shortness of breath: Secondary | ICD-10-CM | POA: Diagnosis present

## 2024-04-10 DIAGNOSIS — D649 Anemia, unspecified: Secondary | ICD-10-CM | POA: Insufficient documentation

## 2024-04-10 DIAGNOSIS — E876 Hypokalemia: Secondary | ICD-10-CM | POA: Diagnosis not present

## 2024-04-10 LAB — RESP PANEL BY RT-PCR (RSV, FLU A&B, COVID)  RVPGX2
Influenza A by PCR: NEGATIVE
Influenza B by PCR: NEGATIVE
Resp Syncytial Virus by PCR: NEGATIVE
SARS Coronavirus 2 by RT PCR: NEGATIVE

## 2024-04-10 LAB — COMPREHENSIVE METABOLIC PANEL WITH GFR
ALT: 7 U/L (ref 0–44)
AST: 12 U/L — ABNORMAL LOW (ref 15–41)
Albumin: 3.8 g/dL (ref 3.5–5.0)
Alkaline Phosphatase: 139 U/L — ABNORMAL HIGH (ref 38–126)
Anion gap: 12 (ref 5–15)
BUN: 13 mg/dL (ref 6–20)
CO2: 24 mmol/L (ref 22–32)
Calcium: 8.8 mg/dL — ABNORMAL LOW (ref 8.9–10.3)
Chloride: 101 mmol/L (ref 98–111)
Creatinine, Ser: 0.98 mg/dL (ref 0.44–1.00)
GFR, Estimated: >60 mL/min (ref >60)
Glucose, Bld: 184 mg/dL — ABNORMAL HIGH (ref 70–99)
Potassium: 3.2 mmol/L — ABNORMAL LOW (ref 3.5–5.1)
Sodium: 137 mmol/L (ref 135–145)
Total Bilirubin: 0.3 mg/dL (ref 0.0–1.2)
Total Protein: 7.4 g/dL (ref 6.5–8.1)

## 2024-04-10 LAB — URINALYSIS, ROUTINE W REFLEX MICROSCOPIC
Bilirubin Urine: NEGATIVE
Glucose, UA: NEGATIVE mg/dL
Ketones, ur: NEGATIVE mg/dL
Leukocytes,Ua: NEGATIVE
Nitrite: NEGATIVE
Protein, ur: 100 mg/dL — AB
Specific Gravity, Urine: >=1.03 (ref 1.005–1.030)
pH: 6 (ref 5.0–8.0)

## 2024-04-10 LAB — CBC
HCT: 28 % — ABNORMAL LOW (ref 36.0–46.0)
Hemoglobin: 7.3 g/dL — ABNORMAL LOW (ref 12.0–15.0)
MCH: 14.6 pg — ABNORMAL LOW (ref 26.0–34.0)
MCHC: 26.1 g/dL — ABNORMAL LOW (ref 30.0–36.0)
MCV: 56 fL — ABNORMAL LOW (ref 80.0–100.0)
Platelets: 569 K/uL — ABNORMAL HIGH (ref 150–400)
RBC: 5 MIL/uL (ref 3.87–5.11)
RDW: 21.4 % — ABNORMAL HIGH (ref 11.5–15.5)
WBC: 7 K/uL (ref 4.0–10.5)
nRBC: 0 % (ref 0.0–0.2)

## 2024-04-10 LAB — GROUP A STREP BY PCR: Group A Strep by PCR: NOT DETECTED

## 2024-04-10 LAB — URINALYSIS, MICROSCOPIC (REFLEX): RBC / HPF: >50 RBC/hpf (ref 0–5)

## 2024-04-10 LAB — PREGNANCY, URINE: Preg Test, Ur: NEGATIVE

## 2024-04-10 NOTE — ED Notes (Signed)

## 2024-04-10 NOTE — ED Triage Notes (Signed)
 Pt c/o lethargy, dyspnea on exertion x 2 weeks, sore throat since yesterday.  Also c/o shooting pain from L hip to L shoulder, worse with movement.   Hx of multiple blood transfusions.

## 2024-04-10 NOTE — Discharge Instructions (Signed)
 Close follow-up with the [facility] clinic.  If your hemoglobin drops below 7 then blood transfusion may be appropriate.  Continue your over-the-counter iron .  Work note provided.  Respiratory panel here negative for COVID flu and RSV and chest x-ray was clear.  Urinalysis did have some blood in it which sometimes mean infection but most likely in this case it has to do with your vaginal bleeding.  Urine sent for culture.  If there is a significant urine infection they will contact you.

## 2024-04-10 NOTE — ED Provider Notes (Addendum)
  [facility] AT [facility] Provider Note   CSN: [id number] Arrival date & time: 04/10/24  1409     Patient presents with: Sore Throat and Weakness   Tracey Copeland is a 33 y.o. female.   Patient with a history of dysmenorrhea and dysfunctional uterine bleeding.  Says she has required blood transfusions for that before.  Hemoglobin here today is 7.3.  Says easy do not transfuse her unless she goes below 7.  Patient really here for dyspnea on exertion for 2 weeks and then developed a sore throat yesterday.  No cough no fevers.  Also has had some lungs terms kind of shooting pain to the left side kind of from the shoulder to hip area.  Made worse with movement not there all the time.  Temp here 98.1 oxygen sats are 96% on room air.  And respiratory is 18 blood pressure 108/61 pulse 83.  Patient denies any chest pain.  Past medical history significant for asthma anemia bipolar disorder schizophrenia fibroids this many years.  Patient's had gallbladder removal in 2022.  Patient does not use tobacco products.  Patient denies any chest pain or any leg swelling.       Prior to Admission medications   Medication Sig Start Date End Date Taking? Authorizing Provider  albuterol  (PROVENTIL  HFA;VENTOLIN  HFA) 108 (90 Base) MCG/ACT inhaler Inhale 2 puffs into the lungs every 6 (six) hours as needed for wheezing or shortness of breath.    [provider]  amoxicillin -clavulanate (AUGMENTIN ) 875-125 MG tablet Take 1 tablet by mouth every 12 (twelve) hours. 07/11/23   Harris, Abigail, PA-C  EPINEPHrine  0.3 mg/0.3 mL IJ SOAJ injection Inject 0.3 mg into the muscle as needed for anaphylaxis. 01/10/19   [provider]  ergocalciferol  (VITAMIN D2) 1.25 MG (50000 UT) capsule Take 1 capsule (50,000 Units total) by mouth once a week. 09/23/18   Franchot Lauraine HERO, NP  ferrous sulfate  325 (65 FE) MG tablet Take 1 tablet (325 mg total) by mouth 2 (two) times daily with a  meal. 02/06/20   Pollina, Lonni PARAS, MD  hydrocortisone  cream 1 % Apply to affected area 2 times daily 08/31/21   Bernis Ernst, PA-C  megestrol  (MEGACE ) 40 MG tablet Take 1 tablet (40 mg total) by mouth daily. 09/23/23   Robinson, John K, PA-C  Multiple Vitamins-Calcium (ONE-A-DAY WOMENS PO) Take 1 tablet by mouth daily.    [provider]  ondansetron  (ZOFRAN -ODT) 4 MG disintegrating tablet Take 1 tablet (4 mg total) by mouth every 8 (eight) hours as needed for nausea or vomiting. 10/25/22   Griselda Norris, MD  oxyCODONE  (OXY IR/ROXICODONE ) 5 MG immediate release tablet Take 1 tablet (5 mg total) by mouth every 6 (six) hours as needed for moderate pain. 12/24/20   Tanda Locus, MD    Allergies: Feraheme  [ferumoxytol ], Iron , and Onion    Review of Systems  Constitutional:  Negative for chills and fever.  HENT:  Positive for sore throat. Negative for ear pain.   Eyes:  Negative for pain and visual disturbance.  Respiratory:  Positive for shortness of breath. Negative for cough.   Cardiovascular:  Negative for chest pain and palpitations.  Gastrointestinal:  Negative for abdominal pain and vomiting.  Genitourinary:  Negative for dysuria and hematuria.  Musculoskeletal:  Negative for arthralgias and back pain.  Skin:  Negative for color change and rash.  Neurological:  Negative for seizures and syncope.  All other systems reviewed and are negative.  Updated Vital Signs BP 108/61 (BP Location: Left Arm)   Pulse 83   Temp 98.1 F (36.7 C) (Oral)   Resp 18   Ht 1.549 m (5' 1)   LMP 04/07/2024 (Approximate)   SpO2 96%   BMI 69.98 kg/m   Physical Exam Vitals and nursing note reviewed.  Constitutional:      General: She is not in acute distress.    Appearance: Normal appearance. She is well-developed. She is not ill-appearing.  HENT:     Head: Normocephalic and atraumatic.     Mouth/Throat:     Mouth: Mucous membranes are moist.     Pharynx: Posterior oropharyngeal  erythema present. No oropharyngeal exudate.  Eyes:     Extraocular Movements: Extraocular movements intact.     Conjunctiva/sclera: Conjunctivae normal.     Pupils: Pupils are equal, round, and reactive to light.  Cardiovascular:     Rate and Rhythm: Normal rate and regular rhythm.     Heart sounds: No murmur heard. Pulmonary:     Effort: Pulmonary effort is normal. No respiratory distress.     Breath sounds: Normal breath sounds. No wheezing, rhonchi or rales.  Abdominal:     Palpations: Abdomen is soft.     Tenderness: There is no abdominal tenderness.  Musculoskeletal:        General: No swelling.     Cervical back: Normal range of motion and neck supple.     Right lower leg: No edema.     Left lower leg: No edema.  Skin:    General: Skin is warm and dry.     Capillary Refill: Capillary refill takes less than 2 seconds.  Neurological:     General: No focal deficit present.     Mental Status: She is alert and oriented to person, place, and time.  Psychiatric:        Mood and Affect: Mood normal.     (all labs ordered are listed, but only abnormal results are displayed) Labs Reviewed  COMPREHENSIVE METABOLIC PANEL WITH GFR - Abnormal; Notable for the following components:      Result Value   Potassium 3.2 (*)    Glucose, Bld 184 (*)    Calcium 8.8 (*)    AST 12 (*)    Alkaline Phosphatase 139 (*)    All other components within normal limits  CBC - Abnormal; Notable for the following components:   Hemoglobin 7.3 (*)    HCT 28.0 (*)    MCV 56.0 (*)    MCH 14.6 (*)    MCHC 26.1 (*)    RDW 21.4 (*)    Platelets 569 (*)    All other components within normal limits  GROUP A STREP BY PCR  RESP PANEL BY RT-PCR (RSV, FLU A&B, COVID)  [id number]  PREGNANCY, URINE  URINALYSIS, ROUTINE W REFLEX MICROSCOPIC    EKG: None  Radiology: No results found.   Procedures   Medications Ordered in the ED - No data to display                                  Medical Decision  Making Amount and/or Complexity of Data Reviewed Labs: ordered. Radiology: ordered.   Patient urinalysis pregnancy test pending.  Complete metabolic panel potassium 3.2 renal function normal with GFR greater than 60 alk phos elevated at 139 total bili normal.  Glucose 184.  Anion gap normal.  CBC  white count 7 hemoglobin 7.3 and platelets are 569.  Rapid strep test negative.  Will get respiratory panel.  Will get chest x-ray.  Patient symptoms could be related to the anemia.    Respiratory panel negative.  Urinalysis cloudy with greater than 50 RBCs and 6-10 white blood cells many bacteria.  Will confirm with the patient is having some vaginal bleeding currently.  Chest x-ray no active disease.  Patient will need close follow-up follow-up for the anemia with the [facility] clinic.  Will send urine for culture.  Patient is having some vaginal bleeding.  So we will just see if the culture grows anything positive will not start her on antibiotics.  Close follow-up [facility] important.  Patient is taking over-the-counter iron  already.  But in the past she had received iron  transfusions with hematology oncology upstairs.  But that has been stopped.  She may need to get that restarted.  As we talked earlier some of her symptoms could be related to the anemia.  Also I do think the sore throat is probably related to an early upper respiratory infection but we know based on respiratory panel not COVID flu or RSV.  Final diagnoses:  Shortness of breath    ED Discharge Orders     None          Geraldene Hamilton, MD 04/10/24 1642    Geraldene Hamilton, MD 04/10/24 1845

## 2024-04-12 LAB — URINE CULTURE
